# Patient Record
Sex: Female | Born: 1965 | Race: White | Hispanic: No | Marital: Married | Smoking: Never smoker
Health system: Southern US, Community
[De-identification: ages and names within clinical notes are randomized; demographics above are authoritative.]

## PROBLEM LIST (undated history)

## (undated) DIAGNOSIS — G709 Myoneural disorder, unspecified: Secondary | ICD-10-CM

## (undated) DIAGNOSIS — N319 Neuromuscular dysfunction of bladder, unspecified: Secondary | ICD-10-CM

## (undated) DIAGNOSIS — G35 Multiple sclerosis: Secondary | ICD-10-CM

## (undated) DIAGNOSIS — R21 Rash and other nonspecific skin eruption: Secondary | ICD-10-CM

## (undated) HISTORY — DX: Neuromuscular dysfunction of bladder, unspecified: N31.9

## (undated) HISTORY — PX: ABDOMINAL HYSTERECTOMY: SHX81

## (undated) HISTORY — DX: Multiple sclerosis: G35

---

## 1996-08-16 HISTORY — PX: KNEE SURGERY: SHX244

## 2004-10-26 ENCOUNTER — Other Ambulatory Visit: Admission: RE | Admit: 2004-10-26 | Discharge: 2004-10-26 | Payer: Self-pay | Admitting: Obstetrics & Gynecology

## 2008-11-04 ENCOUNTER — Encounter: Admission: RE | Admit: 2008-11-04 | Discharge: 2008-11-04 | Payer: Self-pay | Admitting: Family Medicine

## 2008-11-05 ENCOUNTER — Ambulatory Visit (HOSPITAL_BASED_OUTPATIENT_CLINIC_OR_DEPARTMENT_OTHER): Admission: RE | Admit: 2008-11-05 | Discharge: 2008-11-05 | Payer: Self-pay | Admitting: Otolaryngology

## 2010-09-02 ENCOUNTER — Encounter
Admission: RE | Admit: 2010-09-02 | Discharge: 2010-09-02 | Payer: Self-pay | Source: Home / Self Care | Attending: Obstetrics & Gynecology | Admitting: Obstetrics & Gynecology

## 2010-11-26 LAB — POCT HEMOGLOBIN-HEMACUE: Hemoglobin: 13.7 g/dL (ref 12.0–15.0)

## 2010-12-29 NOTE — Op Note (Signed)
NAME:  [name], [name]              ACCOUNT NO.:  [id number]   MEDICAL RECORD NO.:  [id number]          PATIENT TYPE:  AMB   LOCATION:  DSC                          FACILITY:  [facility]   PHYSICIAN:  Christopher E. Ezzard Standing, M.D.DATE OF BIRTH:  07/23/1966   DATE OF PROCEDURE:  11/05/2008  DATE OF DISCHARGE:                               OPERATIVE REPORT   PREOPERATIVE DIAGNOSIS:  Nasal fracture.   POSTOPERATIVE DIAGNOSIS:  Nasal fracture.   OPERATION:  Closed reduction nasal fracture.   SURGEON:  Kristine Garbe. Ezzard Standing, MD   ANESTHESIA:  General LMA.   COMPLICATIONS:  None.   BRIEF CLINICAL NOTE:  Rebecca Mejia is a 46 year old female who was  riding horses on this past Friday in the horse back and the horse's head  hit her nose sustaining a nosebleed and nasal fracture.  She had a CT  scan which shows a depressed right nasal bone fracture.  On examination,  she has slight depression of the right nasal bone.  There is no evidence  of septal hematoma and no significant septal deviation noted.  She is  taken to the operating room at this time for closed reduction nasal  fracture.   DESCRIPTION OF PROCEDURE:  After adequate general anesthetic with LMA,  the patient received 1 gram Ancef IV preoperatively.  Using the butter  knife, the right nasal bone was elevated.  After elevating nasal bone, I  had a tendency to drift back more medial and to support the elevation of  the right nasal bone.  The nose was packed with 1/2 inch of gauze soaked  in bacitracin ointment and a small piece of Telfa placed along the floor  of the nose on the right side only.  No pack was required on the left  side.  After adequate elevation and packing of the nose,  Steri-Strips  and a thermoplastic cast was applied to the nasal dorsum to support and  protect the nasal fracture.  This completed the procedure.  Alante was  awoke from anesthesia and transferred to the recovery room post postop  doing well.   DISPOSITION:  Tequlia is discharged home later this morning on Keflex  500 mg b.i.d. as long as the packing was in along with Tylenol and  Vicodin p.r.n. pain.  However, follow up in my office in 2 days for  recheck and removal of the nasal packing.           ______________________________  Kristine Garbe. Ezzard Standing, M.D.    CEN/MEDQ  D:  11/05/2008  T:  11/06/2008  Job:  [id number]   cc:   Gloriajean Dell. Andrey Campanile, M.D.

## 2011-08-17 HISTORY — PX: NOSE SURGERY: SHX723

## 2012-08-16 HISTORY — PX: OTHER SURGICAL HISTORY: SHX169

## 2015-02-27 ENCOUNTER — Other Ambulatory Visit: Payer: Self-pay | Admitting: Orthopedic Surgery

## 2016-01-29 DIAGNOSIS — M5416 Radiculopathy, lumbar region: Secondary | ICD-10-CM | POA: Insufficient documentation

## 2016-01-29 DIAGNOSIS — M7060 Trochanteric bursitis, unspecified hip: Secondary | ICD-10-CM | POA: Insufficient documentation

## 2016-01-29 DIAGNOSIS — H8110 Benign paroxysmal vertigo, unspecified ear: Secondary | ICD-10-CM | POA: Insufficient documentation

## 2016-04-07 ENCOUNTER — Ambulatory Visit (INDEPENDENT_AMBULATORY_CARE_PROVIDER_SITE_OTHER): Payer: Managed Care, Other (non HMO) | Admitting: Neurology

## 2016-04-07 ENCOUNTER — Encounter: Payer: Self-pay | Admitting: Neurology

## 2016-04-07 VITALS — BP 120/80 | HR 68 | Resp 20 | Ht 64.0 in | Wt 131.0 lb

## 2016-04-07 DIAGNOSIS — G35 Multiple sclerosis: Secondary | ICD-10-CM | POA: Diagnosis not present

## 2016-04-07 DIAGNOSIS — N319 Neuromuscular dysfunction of bladder, unspecified: Secondary | ICD-10-CM

## 2016-04-07 DIAGNOSIS — Z5181 Encounter for therapeutic drug level monitoring: Secondary | ICD-10-CM

## 2016-04-07 HISTORY — DX: Neuromuscular dysfunction of bladder, unspecified: N31.9

## 2016-04-07 MED ORDER — OXYBUTYNIN CHLORIDE 5 MG PO TABS: 5.0000 mg | ORAL_TABLET | Freq: Two times a day (BID) | ORAL | 3 refills | Status: DC

## 2016-04-07 MED ORDER — CHOLECALCIFEROL 50 MCG (2000 UT) PO CAPS: 2000.0000 [IU] | ORAL_CAPSULE | Freq: Every day | ORAL | Status: DC

## 2016-04-07 NOTE — Progress Notes (Signed)
Reason for visit: Multiple sclerosis  Referring physician: Dr. Gwenevere Ghazi Rebecca Mejia is a 50 y.o. female  History of present illness:  Rebecca Mejia is a 50 year old right-handed white female with a history of multiple sclerosis that initially was diagnosed in March 2011. In January of that year, she began having problems with blurred vision and dizziness. The patient eventually underwent MRI of the brain which showed multiple white matter lesions, the diagnosis was confirmed with blood work, spinal fluid analysis, and a visual evoked response test that revealed at abnormalities on the left. The patient was started on Rebif, but she could not tolerate the full dose, and she uses the half dose without significant side effects. The patient has been on the same medication since 2011. She indicates that in the first 18 months after diagnosis, she was having frequent attacks but she did not wish to go on another medication. The patient has not had any new symptoms in 2 or 3 years. She reports some mild cognitive slowing, she has some urinary urgency, frequency and incontinence. She does have some constipation with the bowels. The patient has some slight imbalance, she has not had any significant falls. She does have dizziness if she is too active. She takes Provigil for fatigue which has been helpful for her. She does have some heat intolerance issues. She denies any residual numbness or weakness of the face, arms, or legs. She denies any double vision. Her current neurologist, Dr. Baltazar Najjar, is moving away, and she is seeking a new physician for her multiple sclerosis.  Past Medical History:  Diagnosis Date  . MS (multiple sclerosis) ([place])     Past Surgical History:  Procedure Laterality Date  . arm surgery    . KNEE SURGERY    . NOSE SURGERY      Family History  Problem Relation Age of Onset  . Heart attack Mother     Social history:  reports that she has never smoked. She has never used  smokeless tobacco. She reports that she drinks alcohol. She reports that she does not use drugs.  Medications:  Prior to Admission medications   Medication Sig Start Date End Date Taking? Authorizing Provider  baclofen (LIORESAL) 10 MG tablet Take 10 mg by mouth.   Yes Historical Provider, MD  INTERFERON BETA-1A Inject 22 mcg into the skin 3 (three) times a week.   Yes Historical Provider, MD  modafinil (PROVIGIL) 100 MG tablet Take 100 mg by mouth daily. 02/06/14  Yes Historical Provider, MD      Allergies  Allergen Reactions  . Codeine Hives    ROS:  Out of a complete 14 system review of symptoms, the patient complains only of the following symptoms, and all other reviewed systems are negative.  Blurred vision Dizziness Fatigue  Blood pressure 120/80, pulse 68, resp. rate 20, height 5\' 4"  (1.626 m), weight 131 lb (59.4 kg).  Physical Exam  General: The patient is alert and cooperative at the time of the examination.  Eyes: Pupils are equal, round, and reactive to light. Discs are flat bilaterally.  Neck: The neck is supple, no carotid bruits are noted.  Respiratory: The respiratory examination is clear.  Cardiovascular: The cardiovascular examination reveals a regular rate and rhythm, no obvious murmurs or rubs are noted.  Skin: Extremities are without significant edema.  Neurologic Exam  Mental status: The patient is alert and oriented x 3 at the time of the examination. The patient has apparent normal recent  and remote memory, with an apparently normal attention span and concentration ability.  Cranial nerves: Facial symmetry is present. There is good sensation of the face to pinprick and soft touch bilaterally. The strength of the facial muscles and the muscles to head turning and shoulder shrug are normal bilaterally. Speech is very slightly dysarthric, not aphasic. Extraocular movements are full. Visual fields are full. The tongue is midline, and the patient has  symmetric elevation of the soft palate. No obvious hearing deficits are noted.  Motor: The motor testing reveals 5 over 5 strength of all 4 extremities. Good symmetric motor tone is noted throughout.  Sensory: Sensory testing is intact to pinprick, soft touch, vibration sensation, and position sense on all 4 extremities. No evidence of extinction is noted.  Coordination: Cerebellar testing reveals good finger-nose-finger and heel-to-shin bilaterally.  Gait and station: Gait is normal. Tandem gait is minimally unsteady. Romberg is negative. No drift is seen.  Reflexes: Deep tendon reflexes are symmetric, but are slightly brisk in the lower extremities. Toes are downgoing bilaterally.   Assessment/Plan:  1. Multiple sclerosis  2. Neurogenic bladder  The patient will remain on Rebif for now. We will try to get the disc of the last MRI done through [facility] in September 2016. The patient will be placed on oxybutynin 5 mg twice daily for the neurogenic bladder. She will have blood work done today. She will follow-up in 6 months, sooner if needed. We will plan on rechecking a MRI of the brain within the next 6-12 months.  Jill Alexanders MD 04/07/2016 2:01 PM  [facility] [address] [place], [place] [address]  Phone [phone] Fax [phone]

## 2016-04-07 NOTE — Patient Instructions (Addendum)
   Go on Vitamin D 2000 IU daily.  Multiple Sclerosis Multiple sclerosis (MS) is a disease of the central nervous system. It leads to the loss of the insulating covering of the nerves (myelin sheath) of your brain. When this happens, brain signals do not get sent properly or may not get sent at all. The age of onset of MS varies.  CAUSES The cause of MS is unknown. However, it is more common in the [place] than in the [place]. RISK FACTORS There is a higher number of women with MS than men. MS is not an illness that is passed down to you from your family members (inherited). However, your risk of MS is higher if you have a relative with MS. SIGNS AND SYMPTOMS  The symptoms of MS occur in episodes or attacks. These attacks may last weeks to months. There may be long periods of almost no symptoms between attacks. The symptoms of MS vary. This is because of the many different ways it affects the central nervous system. The main symptoms of MS include:  Vision problems and eye pain.  Numbness.  Weakness.  Inability to move your arms, hands, feet, or legs (paralysis).  Balance problems.  Tremors. DIAGNOSIS  Your health care provider can diagnose MS with the help of imaging exams and lab tests. These may include specialized X-ray exams and spinal fluid tests. The best imaging exam to confirm a diagnosis of MS is an MRI. TREATMENT  There is no known cure for MS, but there are medicines that can decrease the number and frequency of attacks. Steroids are often used for short-term relief. Physical and occupational therapy may also help. There are also many new alternative or complementary treatments available to help control the symptoms of MS. Ask your health care provider if any of these other options are right for you. HOME CARE INSTRUCTIONS   Take medicines as directed by your health care provider.  Exercise as directed by your health care provider. SEEK  MEDICAL CARE IF: You begin to feel depressed. SEEK IMMEDIATE MEDICAL CARE IF:  You develop paralysis.  You have problems with bladder, bowel, or sexual function.  You develop mental changes, such as forgetfulness or mood swings.  You have a period of uncontrolled movements (seizure).   This information is not intended to replace advice given to you by your health care provider. Make sure you discuss any questions you have with your health care provider.   Document Released: 07/30/2000 Document Revised: 08/07/2013 Document Reviewed: 04/09/2013 Elsevier Interactive Patient Education [employer].

## 2016-04-08 ENCOUNTER — Telehealth: Payer: Self-pay

## 2016-04-08 LAB — CBC WITH DIFFERENTIAL/PLATELET
Basophils Absolute: 0 10*3/uL (ref 0.0–0.2)
Basos: 0 %
EOS (ABSOLUTE): 0 10*3/uL (ref 0.0–0.4)
Eos: 1 %
Hematocrit: 37.4 % (ref 34.0–46.6)
Hemoglobin: 12.5 g/dL (ref 11.1–15.9)
Immature Grans (Abs): 0 10*3/uL (ref 0.0–0.1)
Immature Granulocytes: 0 %
Lymphocytes Absolute: 1.8 10*3/uL (ref 0.7–3.1)
Lymphs: 36 %
MCH: 31.6 pg (ref 26.6–33.0)
MCHC: 33.4 g/dL (ref 31.5–35.7)
MCV: 94 fL (ref 79–97)
Monocytes Absolute: 0.4 10*3/uL (ref 0.1–0.9)
Monocytes: 8 %
Neutrophils Absolute: 2.7 10*3/uL (ref 1.4–7.0)
Neutrophils: 55 %
Platelets: 214 10*3/uL (ref 150–379)
RBC: 3.96 x10E6/uL (ref 3.77–5.28)
RDW: 12.6 % (ref 12.3–15.4)
WBC: 4.9 10*3/uL (ref 3.4–10.8)

## 2016-04-08 LAB — COMPREHENSIVE METABOLIC PANEL
ALT: 19 IU/L (ref 0–32)
AST: 28 IU/L (ref 0–40)
Albumin/Globulin Ratio: 1.9 (ref 1.2–2.2)
Albumin: 4.4 g/dL (ref 3.5–5.5)
Alkaline Phosphatase: 63 IU/L (ref 39–117)
BUN/Creatinine Ratio: 16 (ref 9–23)
BUN: 13 mg/dL (ref 6–24)
Bilirubin Total: 0.3 mg/dL (ref 0.0–1.2)
CO2: 26 mmol/L (ref 18–29)
Calcium: 9.8 mg/dL (ref 8.7–10.2)
Chloride: 99 mmol/L (ref 96–106)
Creatinine, Ser: 0.82 mg/dL (ref 0.57–1.00)
GFR calc Af Amer: 95 mL/min/{1.73_m2} (ref >59)
GFR calc non Af Amer: 83 mL/min/{1.73_m2} (ref >59)
Globulin, Total: 2.3 g/dL (ref 1.5–4.5)
Glucose: 81 mg/dL (ref 65–99)
Potassium: 4.4 mmol/L (ref 3.5–5.2)
Sodium: 138 mmol/L (ref 134–144)
Total Protein: 6.7 g/dL (ref 6.0–8.5)

## 2016-04-08 NOTE — Telephone Encounter (Signed)
-----   Message from Kathrynn Ducking, MD sent at 04/08/2016  7:26 AM EDT -----  The blood work results are unremarkable. Please call the patient.  ----- Message ----- From: Lavone Neri Lab Results In Sent: 04/08/2016   5:40 AM To: Kathrynn Ducking, MD

## 2016-04-08 NOTE — Telephone Encounter (Signed)
I spoke to Rebecca Mejia and advised her that per Dr. Jannifer Franklin, her results are unremarkable. Rebecca Mejia verbalized understanding of results. Rebecca Mejia had no questions at this time but was encouraged to call back if questions arise.

## 2016-04-09 ENCOUNTER — Telehealth: Payer: Self-pay

## 2016-04-09 NOTE — Telephone Encounter (Signed)
Spoke to medical records at [facility]. They agreed to mail copy of MRI disc to our office, attn: myself and/or Dr. Jannifer Franklin.

## 2016-04-09 NOTE — Telephone Encounter (Signed)
-----   Message from Kathrynn Ducking, MD sent at 04/07/2016  2:43 PM EDT ----- Please call [facility] and get the disc of the MRI brain from 9/16 sent to this office. Thanks.

## 2016-05-21 ENCOUNTER — Other Ambulatory Visit: Payer: Self-pay

## 2016-05-21 NOTE — Telephone Encounter (Signed)
Received faxed order form from [facility] for Rebif refills which was completed. Awaiting MD review/signature.

## 2016-05-21 NOTE — Telephone Encounter (Signed)
Rx order form signed and faxed to [facility].

## 2016-05-31 ENCOUNTER — Telehealth: Payer: Self-pay | Admitting: Neurology

## 2016-05-31 NOTE — Telephone Encounter (Signed)
Patient is calling. She has MS and is going on a camping trip this weekend and is concerned about getting a bladder infection. Can medication be called in just in case she does get an infection to [facility] on [address]. Please call patient and advise.

## 2016-05-31 NOTE — Telephone Encounter (Signed)
Dr Willis- please advise 

## 2016-05-31 NOTE — Telephone Encounter (Signed)
I called patient. The patient indicates that she is going to be camping only for the weekend, I would probably not call in A prescription for antibiotic unless she actually became symptomatic with a bladder infection.  She is to let me know if she has problems.

## 2016-06-01 MED ORDER — CIPROFLOXACIN HCL 250 MG PO TABS: 250.0000 mg | ORAL_TABLET | Freq: Two times a day (BID) | ORAL | 0 refills | Status: DC

## 2016-06-01 NOTE — Addendum Note (Signed)
Addended by: Margette Fast on: 06/01/2016 06:04 PM   Modules accepted: Orders

## 2016-06-01 NOTE — Telephone Encounter (Signed)
I spoke to pt and advised her that Dr. Brett Fairy (in Dr. Tobey Grim absence) does not feel comfortable prescribing an abx during pt's camping trip in case of bladder infection.  Pt says that she is going camping for a whole week (not just a weekend) and really wants Dr. Jannifer Franklin to reconsider giving her an ABX in case she gets a bladder infection while she is gone for a week.  Pt says that she is agreeable to waiting until Thursday when Dr. Jannifer Franklin returns.

## 2016-06-01 NOTE — Telephone Encounter (Signed)
Pt called back in stating she will be camping for a whole week and will be leaving on Saturday. Please call and advise [phone]

## 2016-06-01 NOTE — Telephone Encounter (Signed)
   I called the patient. [place] out of [place] in [place]. I will call in a 5 day course of Cipro to take only if needed.

## 2016-06-01 NOTE — Telephone Encounter (Signed)
Sent to [facility].  Pt is requesting an abx in case she gets a bladder infection while camping for a week, beginning Saturday.

## 2016-06-01 NOTE — Telephone Encounter (Signed)
See dr Jannifer Franklin comment, CD

## 2016-10-12 ENCOUNTER — Ambulatory Visit (INDEPENDENT_AMBULATORY_CARE_PROVIDER_SITE_OTHER): Payer: Managed Care, Other (non HMO) | Admitting: Neurology

## 2016-10-12 ENCOUNTER — Encounter: Payer: Self-pay | Admitting: *Deleted

## 2016-10-12 ENCOUNTER — Encounter: Payer: Self-pay | Admitting: Neurology

## 2016-10-12 VITALS — BP 130/80 | HR 68 | Ht 64.0 in | Wt 133.0 lb

## 2016-10-12 DIAGNOSIS — G35 Multiple sclerosis: Secondary | ICD-10-CM

## 2016-10-12 DIAGNOSIS — N319 Neuromuscular dysfunction of bladder, unspecified: Secondary | ICD-10-CM

## 2016-10-12 MED ORDER — MODAFINIL 100 MG PO TABS: 100.0000 mg | ORAL_TABLET | Freq: Every day | ORAL | 5 refills | Status: DC

## 2016-10-12 MED ORDER — BACLOFEN 10 MG PO TABS: 10.0000 mg | ORAL_TABLET | Freq: Two times a day (BID) | ORAL | 5 refills | Status: DC | PRN

## 2016-10-12 NOTE — Progress Notes (Signed)
Faxed printed/signed rx to pt pharmacy. [phone]. Received confirmation.

## 2016-10-12 NOTE — Progress Notes (Signed)
Reason for visit: Multiple sclerosis  Rebecca Mejia is an 51 y.o. female  History of present illness:  Ms. Bradd Canary is a 51 year old right-handed white female with a history of multiple sclerosis. The patient has done relatively well in terms of symptomatology with the disease, she does have some underlying fatigue issues and she has a neurogenic bladder, on oxybutynin. The patient reports no new symptoms since last seen, she is on half dose of the Rebif that she takes ongoing. The patient is tolerating the medication well. 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 Subsequently the fatigue has gotten a bit worse. The patient sleeps well at night. She returns to this office for an evaluation. She has not had any vision changes, new numbness or weakness or any changes in bowel or bladder control.  Past Medical History:  Diagnosis Date  . MS (multiple sclerosis) ([place])   . Neurogenic bladder 04/07/2016    Past Surgical History:  Procedure Laterality Date  . arm surgery    . KNEE SURGERY    . NOSE SURGERY      Family History  Problem Relation Age of Onset  . Heart attack Mother   . Osteoporosis Sister     Social history:  reports that she has never smoked. She has never used smokeless tobacco. She reports that she drinks alcohol. She reports that she does not use drugs.    Allergies  Allergen Reactions  . Codeine Hives    Medications:  Prior to Admission medications   Medication Sig Start Date End Date Taking? Authorizing Provider  baclofen (LIORESAL) 10 MG tablet Take 1 tablet (10 mg total) by mouth 2 (two) times daily as needed for muscle spasms. 10/12/16  Yes Kathrynn Ducking, MD  Cholecalciferol (HM VITAMIN D3) 2000 units CAPS Take 1 capsule (2,000 Units total) by mouth daily. 04/07/16  Yes Kathrynn Ducking, MD  ciprofloxacin (CIPRO) 250 MG tablet Take 1 tablet (250 mg total) by mouth 2 (two) times daily. 06/01/16   Yes Kathrynn Ducking, MD  INTERFERON BETA-1A Inject 22 mcg into the skin 3 (three) times a week.   Yes Historical Provider, MD  modafinil (PROVIGIL) 100 MG tablet Take 1 tablet (100 mg total) by mouth daily. 10/12/16  Yes Kathrynn Ducking, MD  oxybutynin (DITROPAN) 5 MG tablet Take 1 tablet (5 mg total) by mouth 2 (two) times daily. 04/07/16  Yes Kathrynn Ducking, MD    ROS:  Out of a complete 14 system review of symptoms, the patient complains only of the following symptoms, and all other reviewed systems are negative.  Fatigue  Blood pressure 130/80, pulse 68, height 5\' 4"  (1.626 m), weight 133 lb (60.3 kg).  Physical Exam  General: The patient is alert and cooperative at the time of the examination.  Skin: No significant peripheral edema is noted.   Neurologic Exam  Mental status: The patient is alert and oriented x 3 at the time of the examination. The patient has apparent normal recent and remote memory, with an apparently normal attention span and concentration ability.   Cranial nerves: Facial symmetry is present. Speech is normal, no aphasia or dysarthria is noted. Extraocular movements are full. Visual fields are full. Pupils are equal, round, and reactive to light. Discs are flat bilaterally.  Motor: The patient has good strength in all 4 extremities.  Sensory examination: Soft touch sensation is symmetric on the  face, arms, and legs.  Coordination: The patient has good finger-nose-finger and heel-to-shin bilaterally.  Gait and station: The patient has a normal gait. Tandem gait is normal. Romberg is negative. No drift is seen.  Reflexes: Deep tendon reflexes are symmetric.   Assessment/Plan:  1. Multiple sclerosis  2. Neurogenic bladder  The patient was given a prescription for Provigil and for baclofen. The patient is doing well on low-dose Rebif. She will follow-up in 6 months, we will repeat MRI evaluation of the brain at that time.  Jill Alexanders  MD 10/12/2016 2:58 PM  [facility] [address] [place], [place] [address]  Phone [phone] Fax [phone]

## 2017-02-02 ENCOUNTER — Telehealth: Payer: Self-pay | Admitting: Neurology

## 2017-02-02 MED ORDER — OXYBUTYNIN CHLORIDE 5 MG PO TABS: 5.0000 mg | ORAL_TABLET | Freq: Two times a day (BID) | ORAL | 3 refills | Status: DC

## 2017-02-02 NOTE — Telephone Encounter (Signed)
A prescription for oxybutynin was given

## 2017-02-02 NOTE — Telephone Encounter (Signed)
Pt request refill for medication that helps to empty the bladder. She doesn't remember the name of it. Please send to [facility]

## 2017-02-02 NOTE — Telephone Encounter (Signed)
Called and spoke with patient. Verified she was referring to oxybutinin. Advised last refill sent 04/07/16 qty60, 3 refills. She states she was only taking prn and not 1 tablet 2x/day. She stopped taking for awhile to see if she could go without it. Advised she should take medication as prescribed. She states she feels medication helped. Would like refills. Advised I will send to CW,MD to review/send refills. We will call back if there are any more questions.

## 2017-03-16 ENCOUNTER — Telehealth: Payer: Self-pay | Admitting: Neurology

## 2017-03-16 DIAGNOSIS — G35 Multiple sclerosis: Secondary | ICD-10-CM

## 2017-03-16 NOTE — Telephone Encounter (Signed)
I called patient. We will get MRI the brain. She indicates that she had blood work done recently through her urologist who is not through [facility], she cannot remember his name.  She will call the office and have them fax the blood work done to our office. If a BUN and creatinine has not been done recently, we will check it before the MRI evaluation.

## 2017-03-16 NOTE — Addendum Note (Signed)
Addended by: Kathrynn Ducking on: 03/16/2017 11:40 AM   Modules accepted: Orders

## 2017-03-16 NOTE — Telephone Encounter (Signed)
Patient would like order put in to have an MRI. Last visit Dr. Jannifer Franklin wanted her to have MRI but never was scheduled.

## 2017-03-17 ENCOUNTER — Telehealth: Payer: Self-pay | Admitting: Neurology

## 2017-03-17 NOTE — Telephone Encounter (Signed)
I received blood work results done on 02/14/2017. BUN is 12, creatinine 0.78, estimated GFR is 87.  Sodium is 138, potassium 4.0, chloride 102, CO2 28, calcium 9.9, total protein 6.8, albumin of 4.3, liver panel is normal.

## 2017-04-11 ENCOUNTER — Ambulatory Visit
Admission: RE | Admit: 2017-04-11 | Discharge: 2017-04-11 | Disposition: A | Discharge status: Home / Self Care | Payer: Managed Care, Other (non HMO) | Source: Ambulatory Visit | Attending: Neurology | Admitting: Neurology

## 2017-04-11 DIAGNOSIS — G35 Multiple sclerosis: Secondary | ICD-10-CM | POA: Diagnosis not present

## 2017-04-11 MED ORDER — GADOBENATE DIMEGLUMINE 529 MG/ML IV SOLN
12.0000 mL | Freq: Once | INTRAVENOUS | Status: AC | PRN
  Administered 2017-04-11: 12 mL via INTRAVENOUS

## 2017-04-12 ENCOUNTER — Telehealth: Payer: Self-pay | Admitting: Neurology

## 2017-04-12 NOTE — Telephone Encounter (Signed)
I called patient. Minimal stable white matter changes seen, no acute or enhancing lesions.   MRI brain 04/11/17:  IMPRESSION:  This MRI of the brain with and without contrast shows the following: 1.   T2/FLAIR hyperintense foci in the left thalamus and in the periventricular, deep and juxtacortical white matter in a pattern and configuration consistent with chronic migraine any plaque associated with multiple sclerosis. None of the foci appears to be acute. 2.    There is a normal enhancement pattern is there are no acute findings.

## 2017-04-19 ENCOUNTER — Encounter: Payer: Self-pay | Admitting: Neurology

## 2017-04-19 ENCOUNTER — Telehealth: Payer: Self-pay | Admitting: Neurology

## 2017-04-19 ENCOUNTER — Ambulatory Visit (INDEPENDENT_AMBULATORY_CARE_PROVIDER_SITE_OTHER): Payer: Managed Care, Other (non HMO) | Admitting: Neurology

## 2017-04-19 VITALS — BP 123/70 | HR 68 | Ht 64.0 in | Wt 133.5 lb

## 2017-04-19 DIAGNOSIS — G35 Multiple sclerosis: Secondary | ICD-10-CM | POA: Diagnosis not present

## 2017-04-19 DIAGNOSIS — N319 Neuromuscular dysfunction of bladder, unspecified: Secondary | ICD-10-CM

## 2017-04-19 DIAGNOSIS — G35D Multiple sclerosis, unspecified: Secondary | ICD-10-CM

## 2017-04-19 NOTE — Telephone Encounter (Signed)
Pt will call to make 6 mo f/u apt.

## 2017-04-19 NOTE — Progress Notes (Signed)
Reason for visit: Multiple sclerosis  Rebecca Mejia is an 51 y.o. female  History of present illness:  Rebecca Mejia is a 51 year old right-handed white female for history of multiple sclerosis. The patient has done well on Rebif, she recently had MRI evaluation of the brain that showed minimal white matter changes, good stability was seen. She is on low-dose Rebif, she could not tolerate the higher dose. The patient has not had any definite MS exacerbations. She did have some problems with cloudy urine, she was treated with antibiotics, and eventually she saw a urology physician. She was told that she needed to perform in and out catheterizations, she has not been on the oxybutynin. The patient has not actually done the catheterizations, she believes that her urine has cleared up and she is voiding well at the moment. The patient returns to this office for an evaluation. She has not had any new numbness or weakness, vision changes, or changes in balance.  Past Medical History:  Diagnosis Date  . MS (multiple sclerosis) ([place])   . Neurogenic bladder 04/07/2016    Past Surgical History:  Procedure Laterality Date  . arm surgery    . KNEE SURGERY    . NOSE SURGERY      Family History  Problem Relation Age of Onset  . Heart attack Mother   . Osteoporosis Sister     Social history:  reports that she has never smoked. She has never used smokeless tobacco. She reports that she drinks alcohol. She reports that she does not use drugs.    Allergies  Allergen Reactions  . Codeine Hives    Medications:  Prior to Admission medications   Medication Sig Start Date End Date Taking? Authorizing Provider  baclofen (LIORESAL) 10 MG tablet Take 1 tablet (10 mg total) by mouth 2 (two) times daily as needed for muscle spasms. 10/12/16  Yes Kathrynn Ducking, MD  Cholecalciferol (HM VITAMIN D3) 2000 units CAPS Take 1 capsule (2,000 Units total) by mouth daily. 04/07/16  Yes Kathrynn Ducking, MD    INTERFERON BETA-1A Inject 22 mcg into the skin 3 (three) times a week.   Yes [provider]  modafinil (PROVIGIL) 100 MG tablet Take 1 tablet (100 mg total) by mouth daily. 10/12/16  Yes Kathrynn Ducking, MD  oxybutynin (DITROPAN) 5 MG tablet Take 1 tablet (5 mg total) by mouth 2 (two) times daily. Patient not taking: Reported on 04/19/2017 02/02/17   Kathrynn Ducking, MD    ROS:  Out of a complete 14 system review of symptoms, the patient complains only of the following symptoms, and all other reviewed systems are negative.  Difficulty voiding bladder  Blood pressure 123/70, pulse 68, height 5\' 4"  (1.626 m), weight 133 lb 8 oz (60.6 kg).  Physical Exam  General: The patient is alert and cooperative at the time of the examination.  Skin: No significant peripheral edema is noted.   Neurologic Exam  Mental status: The patient is alert and oriented x 3 at the time of the examination. The patient has apparent normal recent and remote memory, with an apparently normal attention span and concentration ability.   Cranial nerves: Facial symmetry is present. Speech is normal, no aphasia or dysarthria is noted. Extraocular movements are full. Visual fields are full.Pupils are equal, round, and reactive to light. Discs are flat bilaterally.  Motor: The patient has good strength in all 4 extremities.  Sensory examination: Soft touch sensation is symmetric on the  face, arms, and legs.  Coordination: The patient has good finger-nose-finger and heel-to-shin bilaterally.  Gait and station: The patient has a normal gait. Tandem gait is slightly unsteady. Romberg is negative. No drift is seen.  Reflexes: Deep tendon reflexes are symmetric.   MRI brain 04/11/17:  IMPRESSION: This MRI of the brain with and without contrast shows the following: 1. T2/FLAIR hyperintense foci in the left thalamus and in the periventricular, deep and juxtacortical white matter in a pattern and  configuration consistent with chronic migraine any plaque associated with multiple sclerosis. None of the foci appears to be acute. 2. There is a normal enhancement pattern is there are no acute findings.   * MRI scan images were reviewed online. I agree with the written report.    Assessment/Plan:  1. Multiple sclerosis  2. Neurogenic bladder  The patient is doing relatively well at this time, she will remain on low-dose Rebif. She will follow-up in 6 months. She has undergone recent blood work in July 2018 with a comprehensive metabolic profile, liver enzymes were normal. I will try to get the medical records from her urologist.   Jill Alexanders MD 04/19/2017 2:45 PM  [facility] [address] [place],  [address]  Phone [phone] Fax [phone]

## 2017-04-20 ENCOUNTER — Telehealth: Payer: Self-pay | Admitting: Neurology

## 2017-04-20 NOTE — Telephone Encounter (Signed)
This patient has been seen through urology, the evaluation included a urodynamic study, the patient would feel the bladder up to 800 mL, post void residual greater than 300 mL. The possibility of an InterStim stimulator was entertained. The patient was told that in out catheterizations may be a treatment option.

## 2017-04-29 ENCOUNTER — Other Ambulatory Visit: Payer: Self-pay | Admitting: Neurology

## 2017-05-16 ENCOUNTER — Other Ambulatory Visit: Payer: Self-pay | Admitting: Gastroenterology

## 2017-05-16 DIAGNOSIS — R1084 Generalized abdominal pain: Secondary | ICD-10-CM

## 2017-05-19 ENCOUNTER — Ambulatory Visit
Admission: RE | Admit: 2017-05-19 | Discharge: 2017-05-19 | Disposition: A | Discharge status: Home / Self Care | Payer: Managed Care, Other (non HMO) | Source: Ambulatory Visit | Attending: Gastroenterology | Admitting: Gastroenterology

## 2017-05-19 DIAGNOSIS — R1084 Generalized abdominal pain: Secondary | ICD-10-CM

## 2017-05-19 MED ORDER — IOPAMIDOL (ISOVUE-300) INJECTION 61%
100.0000 mL | Freq: Once | INTRAVENOUS | Status: AC | PRN
  Administered 2017-05-19: 100 mL via INTRAVENOUS

## 2017-06-09 ENCOUNTER — Encounter: Payer: Self-pay | Admitting: Obstetrics and Gynecology

## 2017-06-09 ENCOUNTER — Ambulatory Visit (INDEPENDENT_AMBULATORY_CARE_PROVIDER_SITE_OTHER): Payer: Managed Care, Other (non HMO) | Admitting: Obstetrics and Gynecology

## 2017-06-09 ENCOUNTER — Telehealth: Payer: Self-pay | Admitting: *Deleted

## 2017-06-09 VITALS — BP 112/60 | HR 68 | Resp 16 | Ht 62.75 in | Wt 133.0 lb

## 2017-06-09 DIAGNOSIS — G35 Multiple sclerosis: Secondary | ICD-10-CM | POA: Diagnosis not present

## 2017-06-09 DIAGNOSIS — R1084 Generalized abdominal pain: Secondary | ICD-10-CM

## 2017-06-09 DIAGNOSIS — N319 Neuromuscular dysfunction of bladder, unspecified: Secondary | ICD-10-CM | POA: Diagnosis not present

## 2017-06-09 DIAGNOSIS — D259 Leiomyoma of uterus, unspecified: Secondary | ICD-10-CM | POA: Diagnosis not present

## 2017-06-09 NOTE — Progress Notes (Addendum)
51 y.o. G0P0000 MarriedCaucasianF here to discuss fibroids and possible surgery.   The patient has a very large fibroid uterus, largest fibroid is 15.4 cm.  She doesn't go to the MD often, prior to the last month her last GYN visit was about 20 years ago. She started having discomft in her upper abdomen about a year ago. She was seen an MD at urgent care at the time and was told she was just constipated. She was then seen by GI MD last month and was diagnosed with a large abdominal mass. CT scan showed a very large fibroid uterus extending into her upper abdomen.  She c/o an intermittent tingling pain throughout her abdomen and can feel the hard mass in her upper abdomen. She wonders if this mass is contributing to her constipation or issues with emptying her bladder.  She has MS and suffers with chronic constipation and has trouble emptying her bladder. The urologist has talked to her about self catheterization. She has not wanted to do this and hasn't started it.  Last February she had very bad hot flashes for 2 weeks, then started skipping cycles. Her last 3 cycles have been monthly. She bleeds for 5-6 days. Saturates a super tampon in 3-4 hours. No BTB. Always had pain with sex, in the last 1.5 years it's better.  Period Duration (Days): 5-6 days  Period Pattern: (!) Irregular Menstrual Flow: Moderate, Heavy, Light Menstrual Control: Tampon, Thin pad Menstrual Control Change Freq (Hours): changes tampon/pad every 3-4 hours  Dysmenorrhea: (!) Moderate Dysmenorrhea Symptoms: Cramping, Other (Comment)  Patient's last menstrual period was 05/03/2017.          Sexually active: Yes.    The current method of family planning is vasectomy.    Exercising: Yes.    yard work  Smoker:  no  Health Maintenance: Pap:  06/03/17 WNL  History of abnormal Pap:  no MMG:  05/2017 WNL Colonoscopy:  Never BMD:   Never TDaP:  unsure Gardasil: N/A   reports that she has never smoked. She has never used  smokeless tobacco. She reports that she drinks about 4.2 oz of alcohol per week . She reports that she does not use drugs. She has her own horse business. Husband Rebecca Mejia is a Administrator.    Past Medical History:  Diagnosis Date  . MS (multiple sclerosis) ([place])   . Neurogenic bladder 04/07/2016    Past Surgical History:  Procedure Laterality Date  . arm surgery    . KNEE SURGERY    . NOSE SURGERY      Current Outpatient Prescriptions  Medication Sig Dispense Refill  . baclofen (LIORESAL) 10 MG tablet Take 1 tablet (10 mg total) by mouth 2 (two) times daily as needed for muscle spasms. 60 each 5  . Cholecalciferol (HM VITAMIN D3) 2000 units CAPS Take 1 capsule (2,000 Units total) by mouth daily. 30 each   . INTERFERON BETA-1A Inject 22 mcg into the skin 3 (three) times a week.    . modafinil (PROVIGIL) 100 MG tablet Take 1 tablet (100 mg total) by mouth daily. 30 tablet 5  . REBIF 22 MCG/0.5ML SOSY INJECT 22CMG SUBCUTANEOUSLY THREE TIMES A WEEK 12 Syringe 11   No current facility-administered medications for this visit.     Family History  Problem Relation Age of Onset  . Heart attack Mother   . Breast cancer Sister   . Osteoporosis Sister   . Breast cancer Maternal Grandmother     Review of  Systems  Constitutional: Negative.   HENT: Negative.   Eyes: Negative.   Respiratory: Negative.   Cardiovascular: Negative.   Gastrointestinal: Negative.   Endocrine: Negative.   Genitourinary: Negative.        Abnormal bleeding   Musculoskeletal: Negative.   Skin: Negative.   Allergic/Immunologic: Negative.   Neurological: Negative.   Psychiatric/Behavioral: Negative.     Exam:   BP 112/60 (BP Location: Right Arm, Patient Position: Sitting, Cuff Size: Normal)   Pulse 68   Resp 16   Ht 5' 2.75" (1.594 m)   Wt 133 lb (60.3 kg)   LMP 05/03/2017   BMI 23.75 kg/m   Weight change: @WEIGHTCHANGE @ Height:   Height: 5' 2.75" (159.4 cm)  Ht Readings from Last 3 Encounters:   06/09/17 5' 2.75" (1.594 m)  04/19/17 5\' 4"  (1.626 m)  10/12/16 5\' 4"  (1.626 m)    General appearance: alert, cooperative and appears stated age Head: Normocephalic, without obvious abnormality, atraumatic Neck: no adenopathy, supple, symmetrical, trachea midline and thyroid normal to inspection and palpation Lungs: clear to auscultation bilaterally Cardiovascular: regular rate and rhythm Abdomen: soft, non-tender; large mass in the entire upper abdomen, not filling the lower abdomen as much, not on the right side. Can palpate the uterus in the left lower abdomen, but mobile.  Extremities: extremities normal, atraumatic, no cyanosis or edema Skin: Skin color, texture, turgor normal. No rashes or lesions Lymph nodes: Cervical, supraclavicular, and axillary nodes normal. No abnormal inguinal nodes palpated Neurologic: Grossly normal   Pelvic: External genitalia:  no lesions              Urethra:  normal appearing urethra with no masses, tenderness or lesions              Bartholins and Skenes: normal                 Vagina: normal appearing vagina with normal color and discharge, no lesions              Cervix: no lesions               Bimanual Exam:  Uterus:  very large, irregularly shaped uterus, not in her pelvis, free laterally to the uterus on the right, can palpate the uterus toward the left pelvic side wall, large firm mass in her upper abdomen. Filling her upper abdomen.               Adnexa: no mass, fullness, tenderness               Rectovaginal: Confirms               Anus:  normal sphincter tone, no lesions  Chaperone was present for exam.  05/19/17 CT scan reviewed with the patient, large fibroid uterus, the largest bulk to the uterus is in the upper abdomen. The patient also has a markedly distended bladder. No adnexal masses were seen U/S done on 05/31/17 in GYN office describes a large fibroid uterus and a 7 x 5.4 x 7.28 cm multicystic mass on the right.   A:  Very  large symptomatic fibroid uterus  Right adnexal cyst seen on ultrasound, not on CT  ROMA risk was mildly elevated, mildly elevated CA 19-9, normal CA 125  Neurogenic bladder, her bladder was very enlarged on CT. I discussed with her the long term risk of kidney damage if she has reflux into her kidneys  P:   Records and imaging reviewed  Recent normal pap  Endometrial biopsy with disordered proliferative endometrium with focal glandular crowding  Discussed laparoscopic hysterectomy vs TAH  Discussed that use of lupron would increase the likelihood of a laparoscopic surgery  Addendum: I spoke with the radiologist who re looked at the CT scan, he does see some fluid in the right pelvis, not clear if she has an ovarian or tubal enlargement. No signs of malignancy.  I discussed the patient with Dr Denman George who stated she wouldn't do any thing different with the elevated ROMA or CA 19-9 levels. Suspects they are elevated by her large fibroid uterus. Dr Denman George would be okay with lupron or not using lupron and proceeding with surgery.  I spoke with the patient and her husband. She would like to proceed with surgery without laparoscopy, she is aware of the increased need for laparotomy.  Will get surgery scheduled and plan for a pre-operative anesthesia consult because of her MS. I will also reach out to her neurologist  CC: Dr Jannifer Franklin  06/13/17 Addendum: Dr Jannifer Franklin responded that he see's no contraindications for her surgery. I spoke with the patient. Reviewed the plan of total laparoscopic hysterectomy with bilateral salpingectomy, possible removal of right ovary and cystoscopy. I reviewed the risk of infection, bleeding, damage to nearby organs (bowel, bladder, ureters or vessels), discussed the possible need for laparotomy. Discussed that she would be getting antibiotics and lovenox.

## 2017-06-09 NOTE — Telephone Encounter (Signed)
Call to patient to discuss surgery date options for 06-21-17. Advised of tentative plan and will provide update after able to confirm final arrangements with hospital.  Patient agreeable.

## 2017-06-10 NOTE — Telephone Encounter (Signed)
Call to patient. Advised surgery is scheduled for Tuesday 06-21-17 at 0945 at [facility].  Surgery instruction sheet reviewed and printed copy will be mailed to patient with surgery center brochure. .  Bowel prep instructions reviewed.   Routing to provider for final review. Patient agreeable to disposition. Will close encounter.

## 2017-06-13 NOTE — H&P (Signed)
51 y.o. G0P0000 MarriedCaucasianF here to discuss fibroids and possible surgery.   The patient has a very large fibroid uterus, largest fibroid is 15.4 cm.  She doesn't go to the MD often, prior to the last month her last GYN visit was about 20 years ago. She started having discomft in her upper abdomen about a year ago. She was seen an MD at urgent care at the time and was told she was just constipated. She was then seen by GI MD last month and was diagnosed with a large abdominal mass. CT scan showed a very large fibroid uterus extending into her upper abdomen.  She c/o an intermittent tingling pain throughout her abdomen and can feel the hard mass in her upper abdomen. She wonders if this mass is contributing to her constipation or issues with emptying her bladder.  She has MS and suffers with chronic constipation and has trouble emptying her bladder. The urologist has talked to her about self catheterization. She has not wanted to do this and hasn't started it.  Last February she had very bad hot flashes for 2 weeks, then started skipping cycles. Her last 3 cycles have been monthly. She bleeds for 5-6 days. Saturates a super tampon in 3-4 hours. No BTB. Always had pain with sex, in the last 1.5 years it's better.  Period Duration (Days): 5-6 days  Period Pattern: (!) Irregular Menstrual Flow: Moderate, Heavy, Light Menstrual Control: Tampon, Thin pad Menstrual Control Change Freq (Hours): changes tampon/pad every 3-4 hours  Dysmenorrhea: (!) Moderate Dysmenorrhea Symptoms: Cramping, Other (Comment)  Patient's last menstrual period was 05/03/2017.          Sexually active: Yes.    The current method of family planning is vasectomy.    Exercising: Yes.    yard work  Smoker:  no  Health Maintenance: Pap:  06/03/17 WNL  History of abnormal Pap:  no MMG:  05/2017 WNL Colonoscopy:  Never BMD:   Never TDaP:  unsure Gardasil: N/A   reports that she has never smoked. She has never used  smokeless tobacco. She reports that she drinks about 4.2 oz of alcohol per week . She reports that she does not use drugs. She has her own horse business. Husband Rebecca Mejia is a Administrator.    Past Medical History:  Diagnosis Date  . MS (multiple sclerosis) ([place])   . Neurogenic bladder 04/07/2016    Past Surgical History:  Procedure Laterality Date  . arm surgery    . KNEE SURGERY    . NOSE SURGERY      Current Outpatient Prescriptions  Medication Sig Dispense Refill  . baclofen (LIORESAL) 10 MG tablet Take 1 tablet (10 mg total) by mouth 2 (two) times daily as needed for muscle spasms. 60 each 5  . Cholecalciferol (HM VITAMIN D3) 2000 units CAPS Take 1 capsule (2,000 Units total) by mouth daily. 30 each   . INTERFERON BETA-1A Inject 22 mcg into the skin 3 (three) times a week.    . modafinil (PROVIGIL) 100 MG tablet Take 1 tablet (100 mg total) by mouth daily. 30 tablet 5  . REBIF 22 MCG/0.5ML SOSY INJECT 22CMG SUBCUTANEOUSLY THREE TIMES A WEEK 12 Syringe 11   No current facility-administered medications for this visit.     Family History  Problem Relation Age of Onset  . Heart attack Mother   . Breast cancer Sister   . Osteoporosis Sister   . Breast cancer Maternal Grandmother     Review of  Systems  Constitutional: Negative.   HENT: Negative.   Eyes: Negative.   Respiratory: Negative.   Cardiovascular: Negative.   Gastrointestinal: Negative.   Endocrine: Negative.   Genitourinary: Negative.        Abnormal bleeding   Musculoskeletal: Negative.   Skin: Negative.   Allergic/Immunologic: Negative.   Neurological: Negative.   Psychiatric/Behavioral: Negative.     Exam:   BP 112/60 (BP Location: Right Arm, Patient Position: Sitting, Cuff Size: Normal)   Pulse 68   Resp 16   Ht 5' 2.75" (1.594 m)   Wt 133 lb (60.3 kg)   LMP 05/03/2017   BMI 23.75 kg/m   Weight change: @WEIGHTCHANGE @ Height:   Height: 5' 2.75" (159.4 cm)  Ht Readings from Last 3 Encounters:   06/09/17 5' 2.75" (1.594 m)  04/19/17 5\' 4"  (1.626 m)  10/12/16 5\' 4"  (1.626 m)    General appearance: alert, cooperative and appears stated age Head: Normocephalic, without obvious abnormality, atraumatic Neck: no adenopathy, supple, symmetrical, trachea midline and thyroid normal to inspection and palpation Lungs: clear to auscultation bilaterally Cardiovascular: regular rate and rhythm Abdomen: soft, non-tender; large mass in the entire upper abdomen, not filling the lower abdomen as much, not on the right side. Can palpate the uterus in the left lower abdomen, but mobile.  Extremities: extremities normal, atraumatic, no cyanosis or edema Skin: Skin color, texture, turgor normal. No rashes or lesions Lymph nodes: Cervical, supraclavicular, and axillary nodes normal. No abnormal inguinal nodes palpated Neurologic: Grossly normal   Pelvic: External genitalia:  no lesions              Urethra:  normal appearing urethra with no masses, tenderness or lesions              Bartholins and Skenes: normal                 Vagina: normal appearing vagina with normal color and discharge, no lesions              Cervix: no lesions               Bimanual Exam:  Uterus:  very large, irregularly shaped uterus, not in her pelvis, free laterally to the uterus on the right, can palpate the uterus toward the left pelvic side wall, large firm mass in her upper abdomen. Filling her upper abdomen.               Adnexa: no mass, fullness, tenderness               Rectovaginal: Confirms               Anus:  normal sphincter tone, no lesions  Chaperone was present for exam.  05/19/17 CT scan reviewed with the patient, large fibroid uterus, the largest bulk to the uterus is in the upper abdomen. The patient also has a markedly distended bladder. No adnexal masses were seen U/S done on 05/31/17 in GYN office describes a large fibroid uterus and a 7 x 5.4 x 7.28 cm multicystic mass on the right.   A:  Very  large symptomatic fibroid uterus  Right adnexal cyst seen on ultrasound, not on CT  ROMA risk was mildly elevated, mildly elevated CA 19-9, normal CA 125  Neurogenic bladder, her bladder was very enlarged on CT. I discussed with her the long term risk of kidney damage if she has reflux into her kidneys  P:   Records and imaging reviewed  Recent normal pap  Endometrial biopsy with disordered proliferative endometrium with focal glandular crowding  Discussed laparoscopic hysterectomy vs TAH  Discussed that use of lupron would increase the likelihood of a laparoscopic surgery  Addendum: I spoke with the radiologist who re looked at the CT scan, he does see some fluid in the right pelvis, not clear if she has an ovarian or tubal enlargement. No signs of malignancy.  I discussed the patient with Dr Denman George who stated she wouldn't do any thing different with the elevated ROMA or CA 19-9 levels. Suspects they are elevated by her large fibroid uterus. Dr Denman George would be okay with lupron or not using lupron and proceeding with surgery.  I spoke with the patient and her husband. She would like to proceed with surgery without laparoscopy, she is aware of the increased need for laparotomy.  Will get surgery scheduled and plan for a pre-operative anesthesia consult because of her MS. I will also reach out to her neurologist  CC: Dr Jannifer Franklin  06/13/17 Addendum: Dr Jannifer Franklin responded that he see's no contraindications for her surgery. I spoke with the patient. Reviewed the plan of total laparoscopic hysterectomy with bilateral salpingectomy, possible removal of right ovary and cystoscopy. I reviewed the risk of infection, bleeding, damage to nearby organs (bowel, bladder, ureters or vessels), discussed the possible need for laparotomy. Discussed that she would be getting antibiotics and lovenox.

## 2017-06-13 NOTE — Progress Notes (Signed)
Please place orders in EPIC as patient is being scheduled for a pre-op appointment! Thank you! 

## 2017-06-15 ENCOUNTER — Telehealth: Payer: Self-pay | Admitting: *Deleted

## 2017-06-15 NOTE — Telephone Encounter (Signed)
Per instructions from Dr Talbert Nan, patient may need self cath post -op. Can come in for instructions prior to surgery.  Call to patient. Advised Dr Talbert Nan wants patient to be aware of potential need for self cath due to current urinary retention and surgical procedure may exacerbate thus. Offered nurse visit for education of self cath prior to surgery. Patient states she has already been taught this process and received many supplies from urology office. Currently states she just needs additional practice but will call back if decides she wants nurse visit.   Routing to provider for final review. Patient agreeable to disposition. Will close encounter.

## 2017-06-16 NOTE — Patient Instructions (Addendum)
ANHTHU PERDEW  06/16/2017      Your procedure is scheduled on  06-21-17  Report to [facility]  at  7:30 A.M.     Call this number if you have problems the morning of surgery: [facility].   Remember:  Do not eat food or drink liquids after midnight.  Take these medicines the morning of surgery :  (interferon) REBIF,     Do not wear jewelry, make-up or nail polish.  Do not wear lotions, powders, or perfumes, or deoderant.  Do not shave 48 hours prior to surgery.  Men may shave face and neck.  Do not bring valuables to the hospital.  [facility] is not responsible for any belongings or valuables.  Contacts, dentures or bridgework may not be worn into surgery.  Leave your suitcase in the car.  After surgery it may be brought to your room.  For patients admitted to the hospital, discharge time will be determined by your treatment team.  Special instructions:  N/A  Please read over the following fact sheets that you were given:    [facility] - Preparing for Surgery Before surgery, you can play an important role.  Because skin is not sterile, your skin needs to be as free of germs as possible.  You can reduce the number of germs on your skin by washing with CHG (chlorahexidine gluconate) soap before surgery.  CHG is an antiseptic cleaner which kills germs and bonds with the skin to continue killing germs even after washing. Please DO NOT use if you have an allergy to CHG or antibacterial soaps.  If your skin becomes reddened/irritated stop using the CHG and inform your nurse when you arrive at Short Stay. Do not shave (including legs and underarms) for at least 48 hours prior to the first CHG shower.  You may shave your face/neck. Please follow these instructions carefully:  1.  Shower with CHG Soap the night before surgery and the  morning of Surgery.  2.  If you  choose to wash your hair, wash your hair first as usual with your  normal  shampoo.  3.  After you shampoo, rinse your hair and body thoroughly to remove the  shampoo.                           4.  Use CHG as you would any other liquid soap.  You can apply chg directly  to the skin and wash                       Gently with a scrungie or clean washcloth.  5.  Apply the CHG Soap to your body ONLY FROM THE NECK DOWN.   Do not use on face/ open                           Wound or open sores. Avoid contact with eyes, ears mouth and genitals (private parts).                       Wash face,  Genitals (private parts) with your normal soap.             6.  Wash thoroughly, paying special attention to the  area where your surgery  will be performed.  7.  Thoroughly rinse your body with warm water from the neck down.  8.  DO NOT shower/wash with your normal soap after using and rinsing off  the CHG Soap.                9.  Pat yourself dry with a clean towel.            10.  Wear clean pajamas.            11.  Place clean sheets on your bed the night of your first shower and do not  sleep with pets. Day of Surgery : Do not apply any lotions/deodorants the morning of surgery.  Please wear clean clothes to the hospital/surgery center.  FAILURE TO FOLLOW THESE INSTRUCTIONS MAY RESULT IN THE CANCELLATION OF YOUR SURGERY PATIENT SIGNATURE_________________________________  NURSE SIGNATURE__________________________________  ________________________________________________________________________   Adam Phenix  An incentive spirometer is a tool that can help keep your lungs clear and active. This tool measures how well you are filling your lungs with each breath. Taking long deep breaths may help reverse or decrease the chance of developing breathing (pulmonary) problems (especially infection) following:  A long period of time when you are unable to move or be active. BEFORE THE PROCEDURE   If  the spirometer includes an indicator to show your best effort, your nurse or respiratory therapist will set it to a desired goal.  If possible, sit up straight or lean slightly forward. Try not to slouch.  Hold the incentive spirometer in an upright position. INSTRUCTIONS FOR USE  1. Sit on the edge of your bed if possible, or sit up as far as you can in bed or on a chair. 2. Hold the incentive spirometer in an upright position. 3. Breathe out normally. 4. Place the mouthpiece in your mouth and seal your lips tightly around it. 5. Breathe in slowly and as deeply as possible, raising the piston or the ball toward the top of the column. 6. Hold your breath for 3-5 seconds or for as long as possible. Allow the piston or ball to fall to the bottom of the column. 7. Remove the mouthpiece from your mouth and breathe out normally. 8. Rest for a few seconds and repeat Steps 1 through 7 at least 10 times every 1-2 hours when you are awake. Take your time and take a few normal breaths between deep breaths. 9. The spirometer may include an indicator to show your best effort. Use the indicator as a goal to work toward during each repetition. 10. After each set of 10 deep breaths, practice coughing to be sure your lungs are clear. If you have an incision (the cut made at the time of surgery), support your incision when coughing by placing a pillow or rolled up towels firmly against it. Once you are able to get out of bed, walk around indoors and cough well. You may stop using the incentive spirometer when instructed by your caregiver.  RISKS AND COMPLICATIONS  Take your time so you do not get dizzy or light-headed.  If you are in pain, you may need to take or ask for pain medication before doing incentive spirometry. It is harder to take a deep breath if you are having pain. AFTER USE  Rest and breathe slowly and easily.  It can be helpful to keep track of a log of your progress. Your caregiver can  provide you with a  simple table to help with this. If you are using the spirometer at home, follow these instructions: [place] IF:   You are having difficultly using the spirometer.  You have trouble using the spirometer as often as instructed.  Your pain medication is not giving enough relief while using the spirometer.  You develop fever of 100.5 F (38.1 C) or higher. SEEK IMMEDIATE MEDICAL CARE IF:   You cough up bloody sputum that had not been present before.  You develop fever of 102 F (38.9 C) or greater.  You develop worsening pain at or near the incision site. MAKE SURE YOU:   Understand these instructions.  Will watch your condition.  Will get help right away if you are not doing well or get worse. Document Released: 12/13/2006 Document Revised: 10/25/2011 Document Reviewed: 02/13/2007 [facility] Patient Information 2014 [place], [place].   ________________________________________________________________________

## 2017-06-17 ENCOUNTER — Encounter (HOSPITAL_COMMUNITY)
Admission: RE | Admit: 2017-06-17 | Discharge: 2017-06-17 | Disposition: A | Discharge status: Home / Self Care | Payer: Managed Care, Other (non HMO) | Source: Ambulatory Visit | Attending: Obstetrics and Gynecology | Admitting: Obstetrics and Gynecology

## 2017-06-17 ENCOUNTER — Telehealth: Payer: Self-pay | Admitting: Obstetrics and Gynecology

## 2017-06-17 ENCOUNTER — Encounter (HOSPITAL_COMMUNITY): Payer: Self-pay

## 2017-06-17 DIAGNOSIS — D259 Leiomyoma of uterus, unspecified: Secondary | ICD-10-CM | POA: Diagnosis not present

## 2017-06-17 DIAGNOSIS — Z01818 Encounter for other preprocedural examination: Secondary | ICD-10-CM | POA: Insufficient documentation

## 2017-06-17 HISTORY — DX: Rash and other nonspecific skin eruption: R21

## 2017-06-17 LAB — COMPREHENSIVE METABOLIC PANEL
ALT: 26 U/L (ref 14–54)
AST: 35 U/L (ref 15–41)
Albumin: 3.9 g/dL (ref 3.5–5.0)
Alkaline Phosphatase: 62 U/L (ref 38–126)
Anion gap: 8 (ref 5–15)
BUN: 15 mg/dL (ref 6–20)
CO2: 26 mmol/L (ref 22–32)
Calcium: 9.3 mg/dL (ref 8.9–10.3)
Chloride: 104 mmol/L (ref 101–111)
Creatinine, Ser: 0.78 mg/dL (ref 0.44–1.00)
GFR calc Af Amer: >60 mL/min (ref >60)
GFR calc non Af Amer: >60 mL/min (ref >60)
Glucose, Bld: 90 mg/dL (ref 65–99)
Potassium: 4.1 mmol/L (ref 3.5–5.1)
Sodium: 138 mmol/L (ref 135–145)
Total Bilirubin: 0.4 mg/dL (ref 0.3–1.2)
Total Protein: 7.2 g/dL (ref 6.5–8.1)

## 2017-06-17 LAB — HCG, SERUM, QUALITATIVE: Preg, Serum: NEGATIVE

## 2017-06-17 LAB — ABO/RH: ABO/RH(D): O POS

## 2017-06-17 NOTE — Telephone Encounter (Signed)
Spoke with patient. Patient states that she has been able to self cath and empty her bladder Is having burning and irritation after. Asking if she can use AZO OTC for discomfort. Patient is having surgery on 06/21/2017. Spoke with Dr.Jertson who states she may use OTC AZO, but will need to stop 48  Hours prior to surgery. Patient verbalizes understanding.  Routing to provider for final review. Patient agreeable to disposition. Will close encounter.

## 2017-06-17 NOTE — Progress Notes (Signed)
CBCdiff 06-03-17 on chart from Dr Patrick [facility]

## 2017-06-17 NOTE — Telephone Encounter (Signed)
Patient left a message with the phone service that she was having pain and trouble inserting her catheter.

## 2017-06-17 NOTE — Progress Notes (Signed)
Per surgeon request, anesthesia consult was made d/t patient hx of Multiple Sclerosis. Per anesthesia Dr Bronwen Betters, patient needs general anesthesia to avoid MS exacerbation or flare as may occur with an epidural. MD also gave the okay for patient to take MS medication (interferon)REBIF and Provigil. RN relayed this info to patient. Patient verbalized understanding however states she does not want to take her Provigil AM of surgery.

## 2017-06-21 ENCOUNTER — Ambulatory Visit (HOSPITAL_BASED_OUTPATIENT_CLINIC_OR_DEPARTMENT_OTHER): Payer: Managed Care, Other (non HMO) | Admitting: Anesthesiology

## 2017-06-21 ENCOUNTER — Telehealth: Payer: Self-pay | Admitting: *Deleted

## 2017-06-21 ENCOUNTER — Encounter (HOSPITAL_BASED_OUTPATIENT_CLINIC_OR_DEPARTMENT_OTHER): Payer: Self-pay | Admitting: *Deleted

## 2017-06-21 ENCOUNTER — Ambulatory Visit (HOSPITAL_BASED_OUTPATIENT_CLINIC_OR_DEPARTMENT_OTHER)
Admission: RE | Admit: 2017-06-21 | Discharge: 2017-06-22 | Disposition: A | Discharge status: Home / Self Care | Payer: Managed Care, Other (non HMO) | Source: Ambulatory Visit | Attending: Obstetrics and Gynecology | Admitting: Obstetrics and Gynecology

## 2017-06-21 ENCOUNTER — Encounter (HOSPITAL_BASED_OUTPATIENT_CLINIC_OR_DEPARTMENT_OTHER)
Admission: RE | Disposition: A | Discharge status: Home / Self Care | Payer: Self-pay | Source: Ambulatory Visit | Attending: Obstetrics and Gynecology

## 2017-06-21 DIAGNOSIS — Z8262 Family history of osteoporosis: Secondary | ICD-10-CM | POA: Diagnosis not present

## 2017-06-21 DIAGNOSIS — G35 Multiple sclerosis: Secondary | ICD-10-CM | POA: Insufficient documentation

## 2017-06-21 DIAGNOSIS — D259 Leiomyoma of uterus, unspecified: Secondary | ICD-10-CM | POA: Insufficient documentation

## 2017-06-21 DIAGNOSIS — N840 Polyp of corpus uteri: Secondary | ICD-10-CM | POA: Insufficient documentation

## 2017-06-21 DIAGNOSIS — K5909 Other constipation: Secondary | ICD-10-CM | POA: Insufficient documentation

## 2017-06-21 DIAGNOSIS — N319 Neuromuscular dysfunction of bladder, unspecified: Secondary | ICD-10-CM | POA: Diagnosis not present

## 2017-06-21 DIAGNOSIS — Z8249 Family history of ischemic heart disease and other diseases of the circulatory system: Secondary | ICD-10-CM | POA: Insufficient documentation

## 2017-06-21 DIAGNOSIS — N838 Other noninflammatory disorders of ovary, fallopian tube and broad ligament: Secondary | ICD-10-CM | POA: Insufficient documentation

## 2017-06-21 DIAGNOSIS — Z9889 Other specified postprocedural states: Secondary | ICD-10-CM | POA: Diagnosis not present

## 2017-06-21 DIAGNOSIS — Z803 Family history of malignant neoplasm of breast: Secondary | ICD-10-CM | POA: Diagnosis not present

## 2017-06-21 DIAGNOSIS — Z79899 Other long term (current) drug therapy: Secondary | ICD-10-CM | POA: Insufficient documentation

## 2017-06-21 DIAGNOSIS — Z90710 Acquired absence of both cervix and uterus: Secondary | ICD-10-CM | POA: Diagnosis present

## 2017-06-21 HISTORY — DX: Myoneural disorder, unspecified: G70.9

## 2017-06-21 HISTORY — PX: CYSTOSCOPY: SHX5120

## 2017-06-21 HISTORY — PX: TOTAL LAPAROSCOPIC HYSTERECTOMY WITH SALPINGECTOMY: SHX6742

## 2017-06-21 LAB — CBC
HCT: 38.8 % (ref 36.0–46.0)
Hemoglobin: 13.1 g/dL (ref 12.0–15.0)
MCH: 31.1 pg (ref 26.0–34.0)
MCHC: 33.8 g/dL (ref 30.0–36.0)
MCV: 92.2 fL (ref 78.0–100.0)
Platelets: 217 10*3/uL (ref 150–400)
RBC: 4.21 MIL/uL (ref 3.87–5.11)
RDW: 12.1 % (ref 11.5–15.5)
WBC: 5 10*3/uL (ref 4.0–10.5)

## 2017-06-21 LAB — POCT HEMOGLOBIN-HEMACUE: Hemoglobin: 13 g/dL (ref 12.0–15.0)

## 2017-06-21 LAB — URINALYSIS, ROUTINE W REFLEX MICROSCOPIC
Bilirubin Urine: NEGATIVE
Glucose, UA: NEGATIVE mg/dL
Hgb urine dipstick: NEGATIVE
Ketones, ur: 20 mg/dL — AB
Nitrite: NEGATIVE
Protein, ur: NEGATIVE mg/dL
Specific Gravity, Urine: 1.008 (ref 1.005–1.030)
Squamous Epithelial / LPF: NONE SEEN
pH: 7 (ref 5.0–8.0)

## 2017-06-21 LAB — TYPE AND SCREEN
ABO/RH(D): O POS
Antibody Screen: NEGATIVE

## 2017-06-21 LAB — HEMOGLOBIN: Hemoglobin: 10.4 g/dL — ABNORMAL LOW (ref 12.0–15.0)

## 2017-06-21 SURGERY — HYSTERECTOMY, TOTAL, LAPAROSCOPIC, WITH SALPINGECTOMY
Anesthesia: General | Site: Bladder

## 2017-06-21 MED ORDER — SOD CITRATE-CITRIC ACID 500-334 MG/5ML PO SOLN
30.0000 mL | ORAL | Status: AC
  Administered 2017-06-21: 30 mL via ORAL
  Filled 2017-06-21: qty 30

## 2017-06-21 MED ORDER — SCOPOLAMINE 1 MG/3DAYS TD PT72
MEDICATED_PATCH | TRANSDERMAL | Status: AC
  Filled 2017-06-21: qty 1

## 2017-06-21 MED ORDER — DEXAMETHASONE SODIUM PHOSPHATE 4 MG/ML IJ SOLN
INTRAMUSCULAR | Status: DC | PRN
  Administered 2017-06-21: 10 mg via INTRAVENOUS

## 2017-06-21 MED ORDER — ONDANSETRON HCL 4 MG/2ML IJ SOLN
INTRAMUSCULAR | Status: AC
  Filled 2017-06-21: qty 2

## 2017-06-21 MED ORDER — ONDANSETRON HCL 4 MG/2ML IJ SOLN
INTRAMUSCULAR | Status: DC | PRN
  Administered 2017-06-21: 8 mg via INTRAVENOUS

## 2017-06-21 MED ORDER — DOCUSATE SODIUM 100 MG PO CAPS
100.0000 mg | ORAL_CAPSULE | Freq: Two times a day (BID) | ORAL | Status: DC
  Administered 2017-06-21: 100 mg via ORAL
  Filled 2017-06-21: qty 1

## 2017-06-21 MED ORDER — FENTANYL CITRATE (PF) 100 MCG/2ML IJ SOLN
INTRAMUSCULAR | Status: AC
  Filled 2017-06-21: qty 2

## 2017-06-21 MED ORDER — ROPIVACAINE HCL 5 MG/ML IJ SOLN
INTRAMUSCULAR | Status: DC | PRN
  Administered 2017-06-21: 60 mL

## 2017-06-21 MED ORDER — CEFAZOLIN SODIUM-DEXTROSE 2-4 GM/100ML-% IV SOLN
INTRAVENOUS | Status: AC
  Filled 2017-06-21: qty 100

## 2017-06-21 MED ORDER — HYDROCODONE-ACETAMINOPHEN 5-325 MG PO TABS
1.0000 | ORAL_TABLET | ORAL | Status: DC | PRN
  Filled 2017-06-21: qty 2

## 2017-06-21 MED ORDER — SIMETHICONE 80 MG PO CHEW
80.0000 mg | CHEWABLE_TABLET | Freq: Four times a day (QID) | ORAL | Status: DC | PRN
  Filled 2017-06-21: qty 1

## 2017-06-21 MED ORDER — PROPOFOL 10 MG/ML IV BOLUS
INTRAVENOUS | Status: AC
  Filled 2017-06-21: qty 20

## 2017-06-21 MED ORDER — LACTATED RINGERS IV SOLN
INTRAVENOUS | Status: DC
  Administered 2017-06-21 (×5): via INTRAVENOUS
  Filled 2017-06-21: qty 1000

## 2017-06-21 MED ORDER — KETOROLAC TROMETHAMINE 30 MG/ML IJ SOLN
INTRAMUSCULAR | Status: DC | PRN
  Administered 2017-06-21: 30 mg via INTRAVENOUS

## 2017-06-21 MED ORDER — LACTATED RINGERS IV SOLN
INTRAVENOUS | Status: DC
  Administered 2017-06-21: 09:00:00 via INTRAVENOUS
  Filled 2017-06-21 (×2): qty 1000

## 2017-06-21 MED ORDER — WHITE PETROLATUM EX OINT
TOPICAL_OINTMENT | CUTANEOUS | Status: AC
  Filled 2017-06-21: qty 5

## 2017-06-21 MED ORDER — ROCURONIUM BROMIDE 100 MG/10ML IV SOLN
INTRAVENOUS | Status: DC | PRN
  Administered 2017-06-21 (×4): 10 mg via INTRAVENOUS
  Administered 2017-06-21: 40 mg via INTRAVENOUS

## 2017-06-21 MED ORDER — ACETAMINOPHEN 325 MG PO TABS
650.0000 mg | ORAL_TABLET | ORAL | Status: DC | PRN
  Filled 2017-06-21: qty 2

## 2017-06-21 MED ORDER — ENOXAPARIN SODIUM 40 MG/0.4ML ~~LOC~~ SOLN
40.0000 mg | SUBCUTANEOUS | Status: AC
  Administered 2017-06-21: 40 mg via SUBCUTANEOUS
  Filled 2017-06-21: qty 0.4

## 2017-06-21 MED ORDER — SUGAMMADEX SODIUM 200 MG/2ML IV SOLN
INTRAVENOUS | Status: DC | PRN
  Administered 2017-06-21: 200 mg via INTRAVENOUS

## 2017-06-21 MED ORDER — SODIUM CHLORIDE 0.9 % IJ SOLN
INTRAMUSCULAR | Status: AC
  Filled 2017-06-21: qty 50

## 2017-06-21 MED ORDER — KETOROLAC TROMETHAMINE 30 MG/ML IJ SOLN
30.0000 mg | Freq: Four times a day (QID) | INTRAMUSCULAR | Status: DC
  Administered 2017-06-21 – 2017-06-22 (×2): 30 mg via INTRAVENOUS
  Filled 2017-06-21: qty 1

## 2017-06-21 MED ORDER — DEXAMETHASONE SODIUM PHOSPHATE 10 MG/ML IJ SOLN
INTRAMUSCULAR | Status: AC
  Filled 2017-06-21: qty 1

## 2017-06-21 MED ORDER — SCOPOLAMINE 1 MG/3DAYS TD PT72SCOPOLAMINE 1 MG/3DAYS
MEDICATED_PATCH | TRANSDERMAL | Status: DC | PRN
Start: 2017-06-21 — End: 2017-06-21
  Administered 2017-06-21: 1 via TRANSDERMAL

## 2017-06-21 MED ORDER — LIDOCAINE 2% (20 MG/ML) 5 ML SYRINGE
INTRAMUSCULAR | Status: AC
  Filled 2017-06-21: qty 5

## 2017-06-21 MED ORDER — MIDAZOLAM HCL 2 MG/2ML IJ SOLN
INTRAMUSCULAR | Status: AC
  Filled 2017-06-21: qty 2

## 2017-06-21 MED ORDER — PROPOFOL 10 MG/ML IV BOLUS
INTRAVENOUS | Status: DC | PRN
  Administered 2017-06-21: 50 mg via INTRAVENOUS
  Administered 2017-06-21: 150 mg via INTRAVENOUS
  Administered 2017-06-21: 50 mg via INTRAVENOUS

## 2017-06-21 MED ORDER — CEFAZOLIN SODIUM 1 G IJ SOLR
INTRAMUSCULAR | Status: AC
  Filled 2017-06-21: qty 10

## 2017-06-21 MED ORDER — KETOROLAC TROMETHAMINE 30 MG/ML IJ SOLN
INTRAMUSCULAR | Status: AC
  Filled 2017-06-21: qty 1

## 2017-06-21 MED ORDER — LIDOCAINE 2% (20 MG/ML) 5 ML SYRINGE
INTRAMUSCULAR | Status: DC | PRN
  Administered 2017-06-21: 1.5 mg/kg/h via INTRAVENOUS

## 2017-06-21 MED ORDER — ONDANSETRON HCL 4 MG PO TABS
4.0000 mg | ORAL_TABLET | Freq: Four times a day (QID) | ORAL | Status: DC | PRN
  Filled 2017-06-21: qty 1

## 2017-06-21 MED ORDER — ENOXAPARIN SODIUM 40 MG/0.4ML ~~LOC~~ SOLN
SUBCUTANEOUS | Status: AC
  Filled 2017-06-21: qty 0.4

## 2017-06-21 MED ORDER — ENOXAPARIN SODIUM 40 MG/0.4ML ~~LOC~~ SOLN
40.0000 mg | SUBCUTANEOUS | Status: DC
  Filled 2017-06-21: qty 0.4

## 2017-06-21 MED ORDER — PROMETHAZINE HCL 25 MG/ML IJ SOLN
6.2500 mg | INTRAMUSCULAR | Status: DC | PRN
Start: 2017-06-21 — End: 2017-06-22
  Filled 2017-06-21: qty 1

## 2017-06-21 MED ORDER — MIDAZOLAM HCL 5 MG/5ML IJ SOLN
INTRAMUSCULAR | Status: DC | PRN
  Administered 2017-06-21 (×2): 1 mg via INTRAVENOUS
  Administered 2017-06-21: 2 mg via INTRAVENOUS

## 2017-06-21 MED ORDER — MENTHOL 3 MG MT LOZG
1.0000 | LOZENGE | OROMUCOSAL | Status: DC | PRN
  Filled 2017-06-21: qty 9

## 2017-06-21 MED ORDER — FENTANYL CITRATE (PF) 100 MCG/2ML IJ SOLN
INTRAMUSCULAR | Status: DC | PRN
  Administered 2017-06-21: 25 ug via INTRAVENOUS
  Administered 2017-06-21 (×2): 50 ug via INTRAVENOUS
  Administered 2017-06-21 (×2): 25 ug via INTRAVENOUS
  Administered 2017-06-21 (×2): 50 ug via INTRAVENOUS
  Administered 2017-06-21 (×2): 25 ug via INTRAVENOUS
  Administered 2017-06-21: 50 ug via INTRAVENOUS
  Administered 2017-06-21: 25 ug via INTRAVENOUS

## 2017-06-21 MED ORDER — BUPIVACAINE HCL (PF) 0.25 % IJ SOLN
INTRAMUSCULAR | Status: DC | PRN
  Administered 2017-06-21: 10 mL

## 2017-06-21 MED ORDER — HYDROMORPHONE HCL 1 MG/ML IJ SOLN
INTRAMUSCULAR | Status: AC
  Filled 2017-06-21: qty 1

## 2017-06-21 MED ORDER — KETOROLAC TROMETHAMINE 30 MG/ML IJ SOLN
30.0000 mg | Freq: Four times a day (QID) | INTRAMUSCULAR | Status: DC
  Filled 2017-06-21: qty 1

## 2017-06-21 MED ORDER — ROCURONIUM BROMIDE 50 MG/5ML IV SOSY
PREFILLED_SYRINGE | INTRAVENOUS | Status: AC
  Filled 2017-06-21: qty 5

## 2017-06-21 MED ORDER — LIDOCAINE HCL (CARDIAC) 20 MG/ML IV SOLN
INTRAVENOUS | Status: DC | PRN
  Administered 2017-06-21: 80 mg via INTRAVENOUS

## 2017-06-21 MED ORDER — HYDROMORPHONE HCL 1 MG/ML IJ SOLN
0.2500 mg | INTRAMUSCULAR | Status: DC | PRN
  Filled 2017-06-21: qty 0.5

## 2017-06-21 MED ORDER — VASOPRESSIN 20 UNIT/ML IV SOLN
INTRAVENOUS | Status: DC | PRN
  Administered 2017-06-21: 11 [IU]

## 2017-06-21 MED ORDER — SOD CITRATE-CITRIC ACID 500-334 MG/5ML PO SOLN
ORAL | Status: AC
  Filled 2017-06-21: qty 30

## 2017-06-21 MED ORDER — CEFAZOLIN SODIUM-DEXTROSE 2-4 GM/100ML-% IV SOLN
2.0000 g | INTRAVENOUS | Status: AC
  Administered 2017-06-21 (×2): 2 g via INTRAVENOUS
  Filled 2017-06-21: qty 100

## 2017-06-21 MED ORDER — ONDANSETRON HCL 4 MG/2ML IJ SOLN
4.0000 mg | Freq: Four times a day (QID) | INTRAMUSCULAR | Status: DC | PRN
  Filled 2017-06-21: qty 2

## 2017-06-21 MED ORDER — SODIUM CHLORIDE 0.9 % IR SOLN
Status: DC | PRN
  Administered 2017-06-21: 1000 mL via INTRAVESICAL

## 2017-06-21 MED ORDER — KETOROLAC TROMETHAMINE 30 MG/ML IJ SOLN
30.0000 mg | Freq: Once | INTRAMUSCULAR | Status: DC | PRN
  Filled 2017-06-21: qty 1

## 2017-06-21 MED ORDER — VASOPRESSIN 20 UNIT/ML IV SOLN
INTRAVENOUS | Status: AC
  Filled 2017-06-21: qty 1

## 2017-06-21 MED ORDER — DOCUSATE SODIUM 100 MG PO CAPS
ORAL_CAPSULE | ORAL | Status: AC
  Filled 2017-06-21: qty 1

## 2017-06-21 MED ORDER — LIDOCAINE HCL (PF) 2 % IJ SOLN: INTRAMUSCULAR | Status: DC | PRN

## 2017-06-21 MED ORDER — ZOLPIDEM TARTRATE 5 MG PO TABS
5.0000 mg | ORAL_TABLET | Freq: Every evening | ORAL | Status: DC | PRN
Start: 2017-06-21 — End: 2017-06-22
  Filled 2017-06-21: qty 1

## 2017-06-21 MED ORDER — HYDROMORPHONE HCL 1 MG/ML IJ SOLN
0.2000 mg | INTRAMUSCULAR | Status: DC | PRN
  Administered 2017-06-21 – 2017-06-22 (×2): 0.5 mg via INTRAVENOUS
  Filled 2017-06-21: qty 1

## 2017-06-21 MED ORDER — KCL IN DEXTROSE-NACL 20-5-0.45 MEQ/L-%-% IV SOLN
INTRAVENOUS | Status: DC
  Administered 2017-06-21: 23:00:00 via INTRAVENOUS
  Filled 2017-06-21 (×3): qty 1000

## 2017-06-21 SURGICAL SUPPLY — 76 items
ADH SKN CLS APL DERMABOND .7 (GAUZE/BANDAGES/DRESSINGS) ×4
APL SRG 38 LTWT LNG FL B (MISCELLANEOUS) ×4
APPLICATOR ARISTA FLEXITIP XL (MISCELLANEOUS) ×3 IMPLANT
APPLICATOR COTTON TIP 6IN STRL (MISCELLANEOUS) ×3 IMPLANT
BLADE 10 SAFETY STRL DISP (BLADE) ×21 IMPLANT
CABLE HIGH FREQUENCY MONO STRZ (ELECTRODE) ×3 IMPLANT
CANISTER SUCT 3000ML PPV (MISCELLANEOUS) ×5 IMPLANT
CELL SAVER LIPIGURD (MISCELLANEOUS) ×4 IMPLANT
CLOTH BEACON ORANGE TIMEOUT ST (SAFETY) ×5 IMPLANT
CONT SPEC 4OZ CLIKSEAL STRL BL (MISCELLANEOUS) ×3 IMPLANT
COUNTER NEEDLE 1200 MAGNETIC (NEEDLE) ×3 IMPLANT
COVER MAYO STAND STRL (DRAPES) ×5 IMPLANT
COVER SURGICAL LIGHT HANDLE (MISCELLANEOUS) ×3 IMPLANT
COVER TABLE BACK 60X90 (DRAPES) ×3 IMPLANT
DECANTER SPIKE VIAL GLASS SM (MISCELLANEOUS) ×6 IMPLANT
DERMABOND ADVANCED (GAUZE/BANDAGES/DRESSINGS) ×1
DERMABOND ADVANCED .7 DNX12 (GAUZE/BANDAGES/DRESSINGS) ×4 IMPLANT
DEVICE RETRIEVAL ALEXIS 14 (MISCELLANEOUS) IMPLANT
DRSG OPSITE POSTOP 3X4 (GAUZE/BANDAGES/DRESSINGS) ×3 IMPLANT
DURAPREP 26ML APPLICATOR (WOUND CARE) ×5 IMPLANT
EXTRT SYSTEM ALEXIS 14CM (MISCELLANEOUS) ×5
EXTRT SYSTEM ALEXIS 17CM (MISCELLANEOUS) ×10
GAUZE SPONGE 4X4 16PLY XRAY LF (GAUZE/BANDAGES/DRESSINGS) ×3 IMPLANT
GLOVE BIO SURGEON STRL SZ 6.5 (GLOVE) ×8 IMPLANT
GLOVE BIOGEL PI IND STRL 7.0 (GLOVE) ×16 IMPLANT
GLOVE BIOGEL PI IND STRL 7.5 (GLOVE) ×16 IMPLANT
GLOVE BIOGEL PI INDICATOR 7.0 (GLOVE) ×4
GLOVE BIOGEL PI INDICATOR 7.5 (GLOVE) ×8
GOWN STRL REUS W/TWL LRG LVL3 (GOWN DISPOSABLE) ×23 IMPLANT
HARMONIC RUM II 2.5CM SILVER (DISPOSABLE) ×5
HARMONIC RUM II 3.0CM SILVER (DISPOSABLE)
HARMONIC RUM II 3.5CM SILVER (DISPOSABLE)
HARMONIC RUM II 4.0CM SILVER (DISPOSABLE)
HEMOSTAT ARISTA ABSORB 3G PWDR (MISCELLANEOUS) ×3 IMPLANT
LIGASURE VESSEL 5MM BLUNT TIP (ELECTROSURGICAL) ×5 IMPLANT
MARKER PEN SURG W/LABELS BLK (STERILIZATION PRODUCTS) ×3 IMPLANT
NEEDLE INSUFFLATION 120MM (ENDOMECHANICALS) ×5 IMPLANT
PACK LAPAROSCOPY BASIN (CUSTOM PROCEDURE TRAY) ×5 IMPLANT
PACK TRENDGUARD 450 HYBRID PRO (MISCELLANEOUS) ×2 IMPLANT
PACK TRENDGUARD 600 HYBRD PROC (MISCELLANEOUS) IMPLANT
POUCH LAPAROSCOPIC INSTRUMENT (MISCELLANEOUS) ×8 IMPLANT
PROTECTOR NERVE ULNAR (MISCELLANEOUS) ×10 IMPLANT
RETRACTOR WOUND ALXS 19CM XSML (INSTRUMENTS) ×6 IMPLANT
RTRCTR WOUND ALEXIS 19CM XSML (INSTRUMENTS) ×15
SCALPEL HRMNC RUM II 2.5 SILVR (DISPOSABLE) ×2 IMPLANT
SCALPEL HRMNC RUM II 3.0 SILVR (DISPOSABLE) IMPLANT
SCALPEL HRMNC RUM II 3.5 SILVR (DISPOSABLE) IMPLANT
SCALPEL HRMNC RUM II 4.0 SILVR (DISPOSABLE) IMPLANT
SCISSORS LAP 5X35 DISP (ENDOMECHANICALS) IMPLANT
SET CYSTO W/LG BORE CLAMP LF (SET/KITS/TRAYS/PACK) ×5 IMPLANT
SET IRRIG TUBING LAPAROSCOPIC (IRRIGATION / IRRIGATOR) ×5 IMPLANT
SET TRI-LUMEN FLTR TB AIRSEAL (TUBING) ×5 IMPLANT
SHEARS HARMONIC ACE PLUS 36CM (ENDOMECHANICALS) ×5 IMPLANT
SUT VIC AB 0 CT1 27 (SUTURE) ×5
SUT VIC AB 0 CT1 27XBRD ANBCTR (SUTURE) ×4 IMPLANT
SUT VICRYL 0 UR6 27IN ABS (SUTURE) ×14 IMPLANT
SUT VICRYL 4-0 PS2 18IN ABS (SUTURE) ×5 IMPLANT
SUT VLOC 180 0 9IN  GS21 (SUTURE) ×2
SUT VLOC 180 0 9IN GS21 (SUTURE) ×4 IMPLANT
SYR 50ML LL SCALE MARK (SYRINGE) ×10 IMPLANT
SYR CONTROL 10ML LL (SYRINGE) ×3 IMPLANT
SYSTEM CONTND EXTRCTN KII BLLN (MISCELLANEOUS) ×4 IMPLANT
TIP RUMI ORANGE 6.7MMX12CM (TIP) ×6 IMPLANT
TIP UTERINE 5.1X6CM LAV DISP (MISCELLANEOUS) IMPLANT
TIP UTERINE 6.7X10CM GRN DISP (MISCELLANEOUS) IMPLANT
TIP UTERINE 6.7X6CM WHT DISP (MISCELLANEOUS) IMPLANT
TIP UTERINE 6.7X8CM BLUE DISP (MISCELLANEOUS) IMPLANT
TOWEL OR 17X24 6PK STRL BLUE (TOWEL DISPOSABLE) ×10 IMPLANT
TRAY FOLEY CATH SILVER 14FR (SET/KITS/TRAYS/PACK) ×5 IMPLANT
TRENDGUARD 450 HYBRID PRO PACK (MISCELLANEOUS) ×5
TRENDGUARD 600 HYBRID PROC PK (MISCELLANEOUS)
TROCAR ADV FIXATION 5X100MM (TROCAR) ×8 IMPLANT
TROCAR PORT AIRSEAL 5X120 (TROCAR) ×5 IMPLANT
TROCAR XCEL NON BLADE 8MM B8LT (ENDOMECHANICALS) ×5 IMPLANT
TROCAR XCEL NON-BLD 5MMX100MML (ENDOMECHANICALS) ×2 IMPLANT
WARMER LAPAROSCOPE (MISCELLANEOUS) ×5 IMPLANT

## 2017-06-21 NOTE — Anesthesia Procedure Notes (Signed)
Procedure Name: Intubation Date/Time: 06/21/2017 10:46 AM Performed by: Justice Rocher, CRNA Pre-anesthesia Checklist: Patient identified, Emergency Drugs available, Suction available and Patient being monitored Patient Re-evaluated:Patient Re-evaluated prior to induction Oxygen Delivery Method: Circle system utilized Preoxygenation: Pre-oxygenation with 100% oxygen Induction Type: IV induction Ventilation: Mask ventilation without difficulty Laryngoscope Size: Mac and 3 Grade View: Grade II Tube type: Oral Tube size: 7.0 mm Number of attempts: 1 Airway Equipment and Method: Stylet and Oral airway Placement Confirmation: ETT inserted through vocal cords under direct vision,  positive ETCO2 and breath sounds checked- equal and bilateral Secured at: 22 cm Tube secured with: Tape Dental Injury: Teeth and Oropharynx as per pre-operative assessment

## 2017-06-21 NOTE — OR Nursing (Signed)
Specimen weighed in OR. 1644 grams.  Edyth Gunnels RN

## 2017-06-21 NOTE — Progress Notes (Signed)
Day of Surgery Procedure(s) (LRB): TOTAL LAPAROSCOPIC HYSTERECTOMY WITH SALPINGECTOMY (Bilateral) CYSTOSCOPY (N/A)  Subjective: Patient is without complaints, no pain, no nausea. Thirsty.   Objective: I have reviewed patient's vital signs and intake and output.  General: alert, cooperative and no distress Resp: clear to auscultation bilaterally Cardio: S1, S2 normal GI: soft, non-tender; bowel sounds normal; no masses,  no organomegaly Extremities: extremities normal, atraumatic, no cyanosis or edema Vaginal Bleeding: none  Assessment: s/p Procedure(s): TOTAL LAPAROSCOPIC HYSTERECTOMY WITH SALPINGECTOMY (Bilateral) CYSTOSCOPY (N/A): stable  Plan: Advance diet Encourage ambulation  Will send urine for ua, c&s (cloudy in the OR and she has a h/o frequent UTI's)  LOS: 0 days    Salvadore Dom 06/21/2017, 5:25 PM

## 2017-06-21 NOTE — Anesthesia Postprocedure Evaluation (Signed)
Anesthesia Post Note  Patient: Rebecca Mejia  Procedure(s) Performed: TOTAL LAPAROSCOPIC HYSTERECTOMY WITH SALPINGECTOMY (Bilateral Abdomen) CYSTOSCOPY (N/A Bladder)     Patient location during evaluation: PACU Anesthesia Type: General Level of consciousness: sedated and patient cooperative Pain management: pain level controlled Vital Signs Assessment: post-procedure vital signs reviewed and stable Respiratory status: spontaneous breathing Cardiovascular status: stable Anesthetic complications: no    Last Vitals:  Vitals:   06/21/17 1842 06/21/17 1928  BP: 119/72 125/69  Pulse: 71 67  Resp: 16 16  Temp: 36.6 C 37.3 C  SpO2: 96% 98%    Last Pain:  Vitals:   06/21/17 2000  TempSrc:   PainSc: 2                  Nolon Nations

## 2017-06-21 NOTE — Transfer of Care (Signed)
Immediate Anesthesia Transfer of Care Note  Patient: Rebecca Mejia  Procedure(s) Performed: Procedure(s) (LRB): TOTAL LAPAROSCOPIC HYSTERECTOMY WITH SALPINGECTOMY (Bilateral) CYSTOSCOPY (N/A)  Patient Location: PACU  Anesthesia Type: General  Level of Consciousness: awake, sedated, patient cooperative and responds to stimulation  Airway & Oxygen Therapy: Patient Spontanous Breathing and Patient connected to [place] oxygen  Post-op Assessment: Report given to PACU RN, Post -op Vital signs reviewed and stable and Patient moving all extremities  Post vital signs: Reviewed and stable  Complications: No apparent anesthesia complications

## 2017-06-21 NOTE — Anesthesia Preprocedure Evaluation (Signed)
Anesthesia Evaluation  Patient identified by MRN, date of birth, ID band Patient awake    Reviewed: Allergy & Precautions, NPO status , Patient's Chart, lab work & pertinent test results  Airway Mallampati: II  TM Distance: >3 FB Neck ROM: Full    Dental no notable dental hx.    Pulmonary neg pulmonary ROS,    Pulmonary exam normal breath sounds clear to auscultation       Cardiovascular negative cardio ROS Normal cardiovascular exam Rhythm:Regular Rate:Normal     Neuro/Psych MS  Neuromuscular disease negative psych ROS   GI/Hepatic negative GI ROS, Neg liver ROS,   Endo/Other  negative endocrine ROS  Renal/GU negative Renal ROS  negative genitourinary   Musculoskeletal negative musculoskeletal ROS (+)   Abdominal   Peds negative pediatric ROS (+)  Hematology negative hematology ROS (+)   Anesthesia Other Findings   Reproductive/Obstetrics negative OB ROS                             Anesthesia Physical Anesthesia Plan  ASA: II  Anesthesia Plan: General   Post-op Pain Management:    Induction: Intravenous  PONV Risk Score and Plan: 3 and Ondansetron, Dexamethasone, Treatment may vary due to age or medical condition and Scopolamine patch - Pre-op  Airway Management Planned: Oral ETT  Additional Equipment:   Intra-op Plan:   Post-operative Plan: Extubation in OR  Informed Consent: I have reviewed the patients History and Physical, chart, labs and discussed the procedure including the risks, benefits and alternatives for the proposed anesthesia with the patient or authorized representative who has indicated his/her understanding and acceptance.   Dental advisory given  Plan Discussed with: CRNA and Surgeon  Anesthesia Plan Comments:         Anesthesia Quick Evaluation

## 2017-06-21 NOTE — Telephone Encounter (Signed)
Faxed completed/signed PA refib to [facility] services at [phone]. Awaiting response from insurance.  Pt has been on Rebif since 2011, tolerating well and stable.

## 2017-06-21 NOTE — Op Note (Signed)
Preoperative Diagnosis: Large symptomatic fibroid uterus, right adnexal cyst  Postoperative Diagnosis: Large symptomatic fibroid uterus, right paratubal cyst  Procedure:  Total Laparoscopic Hysterectomy with bilateral salpingectomies, contained uterine morcellation with tissue extraction and cystoscopy  Surgeon: Dr Sumner Boast  Assistant: Dr Edwinna Areola  Anesthesia: General  EBL: 700 cc  Fluids: 4,000 cc LR  Urine output: 900 cc (400 cc at the beginning of the case, 500 cc during the case). Of note the urine looked cloudy, a urine sample will be sent to rule out infection  Complications: None  Indications for surgery: The patient is a 51 year old female, who presented with with a large abdominal pelvic mass. Work up included a CT scan that showed a large fibroid uterus extending into her upper abdomen. The largest myoma was 15.4 cm. She had a benign endometrial biopsy, a normal CBC and a normal pap smear. Given the very large size of her fibroid uterus she was counseled on the use of lupron to try and shrink her uterus prior to attempted laparoscopic hysterectomy. The patient declined, she desired surgery as soon as possible. She was aware of the possible need of laparotomy.  The patient is aware of the risks and complications involved with the surgery and consent was obtained prior to the procedure.  Findings: EUA: large fibroid uterus, filling her upper abdomen and a large portion, but not all of her lower abdomen. On pelvic exam the fibroids were not filling the pelvis. Laparoscopy: large pedunculated fibroid in the upper abdomen. Multiple other fibroids noted intramural and subserosal. She had normal left tube and ovary. She had a large right paratubal cyst, normal right ovary. Normal liver edge. Cystoscopy: normal bladder mucosa and bilateral ureteral jets noted.   Procedure: The patient was taken to the operating room with an IV in placed, preoperative antibiotics had been  administered (Cefazolin 2 grams was given prior to the case and a 2nd dose was given 4 hours after the first dose). She was placed in the dorsal lithotomy position. General anesthesia was administered. She was prepped and draped in the usual sterile fashion for an abdominal, vaginal surgery. A rumi uterine manipulator was placed, using a # 2.5 cup and a 12 cm extender. A foley catheter was placed.    A left upper quadrant incision was made approximately 2 cm below the rib cage in the midclavicular line. The 5 mm opti-view trocar was inserted and the abdominal cavity was insufflated with CO2. The patient was placed in trendelenburg and the abdominal pelvic cavity was inspected. 3 more trocars were placed: 1 in each lateral/mid abdomen and the last in the umbilicus. These areas were injected with 0.25% marcaine, incised with a #11 blade and all trocars were inserted with direct visualization with the laparoscope. A # 5 airseal trocar was placed in the right mid abdomen, a 5 mm trocar in the left mid quadrant and a #8 airseal in the umbilicus. The abdominal pelvic cavity was again inspected.   The right ureter was identified. The right tube/paratubal cyst was then elevated from the pelvic sidewall, cauterized and cut with the ligasure device. The mesosalpinx was cauterized and cut with the ligasure device.  The tube was separated from the uterus using the ligasure device and placed in the cul de sac to be removed later. Her uterine mobilization was very limited secondary to the large pedunculated fibroid, the decision was made to remove the fibroid from the uterus in order to proceed with laparoscopic hysterectomy. The  pedicle was injected with approximately 20 cc of a mixture of vasopressin 20 IU mixed with 100 cc of normal saline. The pedicle was transected with the harmonic. The uterine edge was bleeding, so it was sutured with a 0 V-Lock suture with excellent hemostasis. Some bleeding occurred from the  specimen, nothing was done since it was no longer attached to a blood supply. Once the large fibroid was removed, uterine mobilization markedly improved.   The right round ligament was then cauterized and cut with the ligasure device and the anterior and posterior leafs of the broad ligament were taken down with the ligasure device. The harmonic scalpel was then used to take down the bladder flap and skeltonize the vessels. The right uterine vessels were then clamped and cauterized with the ligasure device. They were not ligated at this time. Hemostasis was excellent. The same procedure was repeated on the left. The ureter was again identified prior to the disection.   Using the rumi manipulator the uterus was pushed up in the pelvic cavity and the harmonic scalpel was used to separate the cervix from the vagina using the harmonic energy. The right tube/paratubal cyst were removed vaginally. The left tube had been previously removed through the #8 trocar.   An attempt was made to place an alexis bag through the vagina to help with removal of at least the uterus, the vaginal opening wasn't sufficient for vaginal dissection. So the decision was made to proceed abdominally.   An occluder was placed in the vagina to maintain pneumoperitoneum.   The vaginal cuff was then closed with a 0 V-lock suture. Hemostasis was excellent.   The umbilical trocar was then removed and the incision was extended to approximately 3 cm. The large alexis bag was placed into the abdominal cavity. The alexis trocar was placed through the umbilicus and the large fibroid was placed in the bag. The alexis trocar and the edge of the alexis bag were pulled out of the umbilical port. The fibroid was then sharply morcellated in the bag and removed in pieces. The bag was removed intact.  A second 14 cm alexis bag was placed back into the abdominal cavity through the umbilical incision and the uterus was placed in the bag and the edges of  the bag were removed from the incision. Again contained morcellation occurred sharply in the bag. The specimen was removed and the bag was removed intact.   The specimen weighed 1,644 grams. It took 2.5 hours for the hysterectomy, then 2.5 hours to morcellate and remove the specimens. It was more than twice the amount of time for a typical laparoscopic hysterectomy.   The fascia of the umbilical incision was closed with a running stitch of 0-Vicryl.   The abdominal pelvic cavity was irrigated and suctioned dry. There was a moderate amount of blood loss, some of which was blood draining from the specimens. Pressure was released and hemostasis remained excellent. Arista was placed in the pelvis.   The trocars were removed. The abdominal cavity was desufflated and the trocars were removed. The skin was closed with subcuticular stiches of 4-0 vicryl and dermabond was placed over the incisions.  The foley catheter was removed and cystoscopy was performed using a 70 degree scope. Both ureters expelled urine, no bladder abnormalities were noted. The bladder was allowed to drain and the cystoscope was removed. A new foley catheter was placed.   The patient's abdomen and perineum were cleansed and she was taken out of the dorsal  lithotomy position. Upon awakening she was extubated and taken to the recovery room in stable condition. The sponge and instrument counts were correct.   CC: Dr Jannifer Franklin

## 2017-06-21 NOTE — Interval H&P Note (Signed)
History and Physical Interval Note:  06/21/2017 8:58 AM  Rebecca Mejia  has presented today for surgery, with the diagnosis of large uterine fibroid, right adnexal cyst  The various methods of treatment have been discussed with the patient and family. After consideration of risks, benefits and other options for treatment, the patient has consented to  Procedure(s) with comments: TOTAL LAPAROSCOPIC HYSTERECTOMY WITH SALPINGECTOMY (Bilateral) LAPAROSCOPIC OOPHORECTOMY   possible (Right) - possible LAPAROTOMY   possible  (N/A) - possible CYSTOSCOPY (N/A) as a surgical intervention .  May need to do a myomectomy in order to get the specimen out laparoscopically, discussed small risk of sarcoma. On review of her codeine allergy, she remembers being itchy, not sure about hives. States she has had medication with codeine since then and did fine. The patient's history has been reviewed, patient examined, no change in status, stable for surgery.  I have reviewed the patient's chart and labs.  Questions were answered to the patient's satisfaction.     Salvadore Dom

## 2017-06-21 NOTE — Telephone Encounter (Signed)
Received fax notification from [facility] that PA Rebif approved effective 06/21/2017-06/21/2018.  Request ID# [id number].

## 2017-06-22 ENCOUNTER — Encounter (HOSPITAL_BASED_OUTPATIENT_CLINIC_OR_DEPARTMENT_OTHER): Payer: Self-pay | Admitting: Obstetrics and Gynecology

## 2017-06-22 DIAGNOSIS — D259 Leiomyoma of uterus, unspecified: Secondary | ICD-10-CM | POA: Diagnosis not present

## 2017-06-22 LAB — CBC
HCT: 27.6 % — ABNORMAL LOW (ref 36.0–46.0)
Hemoglobin: 9.5 g/dL — ABNORMAL LOW (ref 12.0–15.0)
MCH: 31.6 pg (ref 26.0–34.0)
MCHC: 34.4 g/dL (ref 30.0–36.0)
MCV: 91.7 fL (ref 78.0–100.0)
Platelets: 173 10*3/uL (ref 150–400)
RBC: 3.01 MIL/uL — ABNORMAL LOW (ref 3.87–5.11)
RDW: 12 % (ref 11.5–15.5)
WBC: 7.5 10*3/uL (ref 4.0–10.5)

## 2017-06-22 LAB — URINE CULTURE: Culture: NO GROWTH

## 2017-06-22 MED ORDER — KETOROLAC TROMETHAMINE 30 MG/ML IJ SOLN
INTRAMUSCULAR | Status: AC
  Filled 2017-06-22: qty 1

## 2017-06-22 MED ORDER — HYDROMORPHONE HCL 1 MG/ML IJ SOLN
INTRAMUSCULAR | Status: AC
  Filled 2017-06-22: qty 1

## 2017-06-22 MED ORDER — HYDROMORPHONE HCL 2 MG PO TABS: ORAL_TABLET | ORAL | 0 refills | Status: DC

## 2017-06-22 MED ORDER — HYDROMORPHONE HCL 2 MG PO TABS
ORAL_TABLET | ORAL | Status: AC
  Filled 2017-06-22: qty 1

## 2017-06-22 MED ORDER — HYDROMORPHONE HCL 2 MG PO TABS
2.0000 mg | ORAL_TABLET | Freq: Once | ORAL | Status: AC
  Administered 2017-06-22: 2 mg via ORAL
  Filled 2017-06-22: qty 1

## 2017-06-22 MED ORDER — HYDROCODONE-ACETAMINOPHEN 5-325 MG PO TABS
ORAL_TABLET | ORAL | Status: AC
  Filled 2017-06-22: qty 2

## 2017-06-22 MED ORDER — IBUPROFEN 800 MG PO TABS: 800.0000 mg | ORAL_TABLET | Freq: Three times a day (TID) | ORAL | 0 refills | Status: DC | PRN

## 2017-06-22 NOTE — Progress Notes (Signed)
1 Day Post-Op Procedure(s) (LRB): TOTAL LAPAROSCOPIC HYSTERECTOMY WITH SALPINGECTOMY (Bilateral) CYSTOSCOPY (N/A).  Subjective: Patient reports she is doing well, tolerating po, ambulating, pain well controlled. Her biggest c/o is of shoulder pain. Abdomen is sore. Last narcotic dose was over 5 hours ago. She still has the catheter in.    Objective: I have reviewed patient's vital signs, intake and output and labs.  General: alert, cooperative and no distress Resp: clear to auscultation bilaterally Cardio: S1, S2 normal GI: soft, appropriately tender, mildly distended, NABS. Dressings are dry  Extremities: extremities normal, atraumatic, no cyanosis or edema Vaginal Bleeding: minimal  Assessment: s/p Procedure(s): TOTAL LAPAROSCOPIC HYSTERECTOMY WITH SALPINGECTOMY (Bilateral) CYSTOSCOPY (N/A): stable, progressing well and tolerating diet  Plan: Advance to PO medication Discontinue IV fluids Discharge home will remove foley. She has a h/o urinary retention and will self cath at home  LOS: 0 days    Salvadore Dom 06/22/2017, 7:51 AM

## 2017-06-23 NOTE — Discharge Summary (Signed)
Physician Discharge Summary   Patient ID: Rebecca Mejia [id number] 51 y.o. July 31, 1966  Admit date: 06/21/2017  Discharge date and time: 06/22/2017  8:40 AM   Admitting Physician: Salvadore Dom, MD   Discharge Physician: Salvadore Dom, MD  Admission Diagnoses: large uterine fibroid, right adnexal cyst  Discharge Diagnoses: Same, s/p Total laparoscopic hysterectomy, bilateral salpingectomy, cystoscopy  Admission Condition: good  Discharged Condition: good  Indication for Admission: Surgery  Hospital Course: The patient underwent an uncomplicated surgery, normal postoperative course  Consults: None  Discharge Exam:See post op day #1 progress note  Disposition: 01-Home or Self Care  Patient Instructions:  Allergies as of 06/22/2017      Reactions   Codeine Hives      Medication List    TAKE these medications   baclofen 10 MG tablet Commonly known as:  LIORESAL Take 1 tablet (10 mg total) by mouth 2 (two) times daily as needed for muscle spasms.   Cholecalciferol 2000 units Caps Commonly known as:  HM VITAMIN D3 Take 1 capsule (2,000 Units total) by mouth daily.   HYDROmorphone 2 MG tablet Commonly known as:  DILAUDID 1-2 tablets every 4-6 hours as needed for pain   ibuprofen 800 MG tablet Commonly known as:  ADVIL,MOTRIN Take 1 tablet (800 mg total) every 8 (eight) hours as needed by mouth.   INTERFERON BETA-1A Inject 22 mcg into the skin 3 (three) times a week. Patient reports taking M,W,F   modafinil 100 MG tablet Commonly known as:  PROVIGIL Take 1 tablet (100 mg total) by mouth daily. What changed:    when to take this  reasons to take this            Discharge Care Instructions  (From admission, onward)        Start     Ordered   06/22/17 0000  Discharge wound care:    Comments:  All dressings can be removed this evening   06/22/17 0803      Follow-up with Dr Talbert Nan next week  Signed: Salvadore Dom 06/23/2017 6:23 PM

## 2017-06-28 ENCOUNTER — Ambulatory Visit (INDEPENDENT_AMBULATORY_CARE_PROVIDER_SITE_OTHER): Payer: Managed Care, Other (non HMO) | Admitting: Obstetrics and Gynecology

## 2017-06-28 ENCOUNTER — Encounter: Payer: Self-pay | Admitting: Obstetrics and Gynecology

## 2017-06-28 ENCOUNTER — Other Ambulatory Visit: Payer: Self-pay

## 2017-06-28 VITALS — BP 118/70 | HR 80 | Resp 14 | Wt 123.0 lb

## 2017-06-28 DIAGNOSIS — R3915 Urgency of urination: Secondary | ICD-10-CM

## 2017-06-28 DIAGNOSIS — R350 Frequency of micturition: Secondary | ICD-10-CM | POA: Diagnosis not present

## 2017-06-28 DIAGNOSIS — Z90710 Acquired absence of both cervix and uterus: Secondary | ICD-10-CM

## 2017-06-28 DIAGNOSIS — R300 Dysuria: Secondary | ICD-10-CM

## 2017-06-28 LAB — POCT URINALYSIS DIPSTICK
Bilirubin, UA: NEGATIVE
Blood, UA: NEGATIVE
Glucose, UA: NEGATIVE
Ketones, UA: NEGATIVE
Nitrite, UA: NEGATIVE
Protein, UA: NEGATIVE
Urobilinogen, UA: NEGATIVE E.U./dL — AB
pH, UA: 6 (ref 5.0–8.0)

## 2017-06-28 MED ORDER — PHENAZOPYRIDINE HCL 200 MG PO TABS: 200.0000 mg | ORAL_TABLET | Freq: Three times a day (TID) | ORAL | 0 refills | Status: DC | PRN

## 2017-06-28 MED ORDER — SULFAMETHOXAZOLE-TRIMETHOPRIM 800-160 MG PO TABS: 1.0000 | ORAL_TABLET | Freq: Two times a day (BID) | ORAL | 0 refills | Status: DC

## 2017-06-28 NOTE — Patient Instructions (Signed)
No heavy lifting (not over 10 lbs), no working with the horses until 1 month after surgery. No intercourse for 8-12 weeks. Driving is fine

## 2017-06-28 NOTE — Progress Notes (Signed)
GYNECOLOGY  VISIT   HPI: 51 y.o.   Married  Caucasian  female   G0P0000 with Patient's last menstrual period was 05/03/2017.   here for follow up. Patient is 1 weeks s/p TLH. She has been voiding so much better since her surgery. She was catheterizing her self, but has stopped since Wednesday. She c/o mild urinary urgency, frequency and dysuria over the last few days. She has a h/o urinary retention and has frequent UTI's.  She feels so much better since the surgery. She didn't realize how much discomfort she had from her fibroid uterus. Only taking ibuprofen and tylenol. Stopped with the narcotic on Friday. She didn't have a BM with the pain meds. On Thursday she took miralax and stool softer and nothing happened. She took some magnesium citrate, now with some diarrhea, now just once a day (loose).   GYNECOLOGIC HISTORY: Patient's last menstrual period was 05/03/2017. Contraception:hysterectomy  Menopausal hormone therapy: none         OB History    Gravida Para Term Preterm AB Living   0 0 0 0 0 0   SAB TAB Ectopic Multiple Live Births   0 0 0 0 0         Patient Active Problem List   Diagnosis Date Noted  . Status post laparoscopic hysterectomy 06/21/2017  . Multiple sclerosis ([place]) 04/07/2016  . Neurogenic bladder 04/07/2016    Past Medical History:  Diagnosis Date  . MS (multiple sclerosis) ([place])   . Neurogenic bladder 04/07/2016  . Neuromuscular disorder ([place])    MS  . Rash    patient reports red, flat rash that is now "cyling out"     Past Surgical History:  Procedure Laterality Date  . ABDOMINAL HYSTERECTOMY    . arm surgery  2014   broken left wrist after fall from side of [place]ain while horse riding;  still has rods in wrist   . KNEE SURGERY  1998   reconstructive ACL   . NOSE SURGERY  2013   broken nose after fall from horse     Current Outpatient Medications  Medication Sig Dispense Refill  . baclofen (LIORESAL) 10 MG tablet Take 1 tablet (10 mg total) by  mouth 2 (two) times daily as needed for muscle spasms. 60 each 5  . Cholecalciferol (HM VITAMIN D3) 2000 units CAPS Take 1 capsule (2,000 Units total) by mouth daily. 30 each   . HYDROmorphone (DILAUDID) 2 MG tablet 1-2 tablets every 4-6 hours as needed for pain 30 tablet 0  . ibuprofen (ADVIL,MOTRIN) 800 MG tablet Take 1 tablet (800 mg total) every 8 (eight) hours as needed by mouth. 30 tablet 0  . INTERFERON BETA-1A Inject 22 mcg into the skin 3 (three) times a week. Patient reports taking M,W,F    . modafinil (PROVIGIL) 100 MG tablet Take 1 tablet (100 mg total) by mouth daily. (Patient taking differently: Take 100 mg by mouth daily as needed (usually takes 1/2 of tablet). ) 30 tablet 5   No current facility-administered medications for this visit.      ALLERGIES: Codeine  Family History  Problem Relation Age of Onset  . Heart attack Mother   . Breast cancer Sister   . Osteoporosis Sister   . Breast cancer Maternal Grandmother     Social History   Socioeconomic History  . Marital status: Married    Spouse name: Not on file  . Number of children: Not on file  . Years of education:  Not on file  . Highest education level: Not on file  Social Needs  . Financial resource strain: Not on file  . Food insecurity - worry: Not on file  . Food insecurity - inability: Not on file  . Transportation needs - medical: Not on file  . Transportation needs - non-medical: Not on file  Occupational History  . Not on file  Tobacco Use  . Smoking status: Never Smoker  . Smokeless tobacco: Never Used  Substance and Sexual Activity  . Alcohol use: Yes    Alcohol/week: 4.2 oz    Types: 7 Glasses of wine per week  . Drug use: No  . Sexual activity: Yes    Partners: Male    Birth control/protection: None    Comment: husband vasectomy   Other Topics Concern  . Not on file  Social History Narrative  . Not on file    Review of Systems  Constitutional: Negative.   HENT: Negative.   Eyes:  Negative.   Respiratory: Negative.   Cardiovascular: Negative.   Gastrointestinal: Negative.   Genitourinary: Positive for dysuria, frequency and urgency.  Musculoskeletal: Negative.   Skin: Negative.   Neurological: Negative.   Endo/Heme/Allergies: Negative.   Psychiatric/Behavioral: Negative.     PHYSICAL EXAMINATION:    BP 118/70 (BP Location: Right Arm, Patient Position: Sitting, Cuff Size: Normal)   Pulse 80   Resp 14   Wt 123 lb (55.8 kg)   LMP 05/03/2017   BMI 21.79 kg/m     General appearance: alert, cooperative and appears stated age Abdomen: soft, non-tender; non distended, no masses,  no organomegaly Incisions are healing well   Urine dip: 3+ leuks, otherwise negative  ASSESSMENT 1 week s/p TLH/BS of a very large fibroid uterus. She is feeling well C/O urinary urgency, frequency and dysuria. She has a h/o urinary retention and frequent UTI's She hasn't been self catheterizing since Wednesday    PLAN F/U in 3 weeks Will send urine for ua, c&s Treat for possible UTI Recommended she catheterize herself to make sure she is emptying appropriately (hat given)  An After Visit Summary was printed and given to the patient.

## 2017-06-29 LAB — URINALYSIS, MICROSCOPIC ONLY
Casts: NONE SEEN /lpf
Epithelial Cells (non renal): NONE SEEN /hpf (ref 0–10)
WBC, UA: >30 /hpf — AB (ref 0–?)

## 2017-06-30 LAB — URINE CULTURE

## 2017-07-01 ENCOUNTER — Telehealth: Payer: Self-pay

## 2017-07-01 NOTE — Telephone Encounter (Signed)
Spoke with patient. Results given. Patient reports she is feeling much better. Symptoms have resolved. Aware to contact the office if symptoms return. Encounter closed.

## 2017-07-01 NOTE — Telephone Encounter (Signed)
-----   Message from Salvadore Dom, MD sent at 06/30/2017  5:49 PM EST ----- Please let the patient know that she does have a UTI. It is sensitive to the antibiotic she is on. Please make sure she is feeling better.

## 2017-07-05 ENCOUNTER — Other Ambulatory Visit: Payer: Self-pay

## 2017-07-05 ENCOUNTER — Ambulatory Visit (INDEPENDENT_AMBULATORY_CARE_PROVIDER_SITE_OTHER): Payer: Managed Care, Other (non HMO) | Admitting: Obstetrics and Gynecology

## 2017-07-05 ENCOUNTER — Encounter: Payer: Self-pay | Admitting: Obstetrics and Gynecology

## 2017-07-05 ENCOUNTER — Telehealth: Payer: Self-pay | Admitting: Obstetrics and Gynecology

## 2017-07-05 VITALS — BP 112/80 | HR 68 | Temp 97.6°F | Resp 16 | Wt 125.0 lb

## 2017-07-05 DIAGNOSIS — R300 Dysuria: Secondary | ICD-10-CM

## 2017-07-05 DIAGNOSIS — R339 Retention of urine, unspecified: Secondary | ICD-10-CM

## 2017-07-05 DIAGNOSIS — G35 Multiple sclerosis: Secondary | ICD-10-CM | POA: Diagnosis not present

## 2017-07-05 DIAGNOSIS — N319 Neuromuscular dysfunction of bladder, unspecified: Secondary | ICD-10-CM

## 2017-07-05 DIAGNOSIS — R3915 Urgency of urination: Secondary | ICD-10-CM

## 2017-07-05 MED ORDER — NITROFURANTOIN MACROCRYSTAL 50 MG PO CAPS: ORAL_CAPSULE | ORAL | 0 refills | Status: DC

## 2017-07-05 MED ORDER — LIDOCAINE HCL 2 % EX GEL: 1.0000 "application " | Freq: Two times a day (BID) | CUTANEOUS | 1 refills | Status: DC

## 2017-07-05 MED ORDER — NITROFURANTOIN MONOHYD MACRO 100 MG PO CAPS: 100.0000 mg | ORAL_CAPSULE | Freq: Two times a day (BID) | ORAL | 0 refills | Status: DC

## 2017-07-05 NOTE — Progress Notes (Signed)
GYNECOLOGY  VISIT   HPI: 51 y.o.   Married  Caucasian  female   [place] with Patient's last menstrual period was 05/03/2017.   here c/o dysuria and urinary urgency X 2 days  She was treated for a UTI last week, felt better with the Bactrim. The patient has a h/o MS and bladder dysfunction. Prior to the hysterectomy she was instructed on self cath by urology. She wasn't doing it until just prior to her surgery.  When she first got out of the hospital she felt like she was voiding better. Then she got the UTI and wasn't peeing as well. She wasn't catheterizing daily. Husband was having trouble catheterizing her, getting better at it. He is only catheterizing her one x a day. She can't void prior to being catheterized, thinks it is the stress of being catheterized. Not voiding as well in the last week. She tries to void 4-5 x a day.  Her UTI symptoms started up again last night. She feels the urge to void and can't go. She is having pain from being catheterized. She has a 14 french catheter at home. No blood in her urine, but when she is catheterized there is blood in the catheter.  BM are better since she got off the surgery. Better than prior to surgery. Other than her bladder, she is feeling much better.  No fever, or flank pain.   GYNECOLOGIC HISTORY: Patient's last menstrual period was 05/03/2017. Contraception:Hysterectomy Menopausal hormone therapy: none         OB History    Gravida Para Term Preterm AB Living   0 0 0 0 0 0   SAB TAB Ectopic Multiple Live Births   0 0 0 0 0         Patient Active Problem List   Diagnosis Date Noted  . Status post laparoscopic hysterectomy 06/21/2017  . Multiple sclerosis ([place]) 04/07/2016  . Neurogenic bladder 04/07/2016    Past Medical History:  Diagnosis Date  . MS (multiple sclerosis) ([place])   . Neurogenic bladder 04/07/2016  . Neuromuscular disorder ([place])    MS  . Rash    patient reports red, flat rash that is now "cyling out"      Past Surgical History:  Procedure Laterality Date  . ABDOMINAL HYSTERECTOMY    . arm surgery  2014   broken left wrist after fall from side of [place] while horse riding;  still has rods in wrist   . CYSTOSCOPY N/A 06/21/2017   Procedure: CYSTOSCOPY;  Surgeon: Salvadore Dom, MD;  [facility];  Service: Gynecology;  Laterality: N/A;  . KNEE SURGERY  1998   reconstructive ACL   . NOSE SURGERY  2013   broken nose after fall from horse   . TOTAL LAPAROSCOPIC HYSTERECTOMY WITH SALPINGECTOMY Bilateral 06/21/2017   Procedure: TOTAL LAPAROSCOPIC HYSTERECTOMY WITH SALPINGECTOMY;  Surgeon: Salvadore Dom, MD;  Location: [facility];  Service: Gynecology;  Laterality: Bilateral;    Current Outpatient Medications  Medication Sig Dispense Refill  . baclofen (LIORESAL) 10 MG tablet Take 1 tablet (10 mg total) by mouth 2 (two) times daily as needed for muscle spasms. 60 each 5  . Cholecalciferol (HM VITAMIN D3) 2000 units CAPS Take 1 capsule (2,000 Units total) by mouth daily. 30 each   . ibuprofen (ADVIL,MOTRIN) 800 MG tablet Take 1 tablet (800 mg total) every 8 (eight) hours as needed by mouth. 30 tablet 0  . INTERFERON BETA-1A Inject 22  mcg into the skin 3 (three) times a week. Patient reports taking M,W,F    . modafinil (PROVIGIL) 100 MG tablet Take 1 tablet (100 mg total) by mouth daily. (Patient taking differently: Take 100 mg by mouth daily as needed (usually takes 1/2 of tablet). ) 30 tablet 5   No current facility-administered medications for this visit.      ALLERGIES: Codeine  Family History  Problem Relation Age of Onset  . Heart attack Mother   . Breast cancer Sister   . Osteoporosis Sister   . Breast cancer Maternal Grandmother     Social History   Socioeconomic History  . Marital status: Married    Spouse name: Not on file  . Number of children: Not on file  . Years of education: Not on file  . Highest education  level: Not on file  Social Needs  . Financial resource strain: Not on file  . Food insecurity - worry: Not on file  . Food insecurity - inability: Not on file  . Transportation needs - medical: Not on file  . Transportation needs - non-medical: Not on file  Occupational History  . Not on file  Tobacco Use  . Smoking status: Never Smoker  . Smokeless tobacco: Never Used  Substance and Sexual Activity  . Alcohol use: Yes    Alcohol/week: 4.2 oz    Types: 7 Glasses of wine per week  . Drug use: No  . Sexual activity: Yes    Partners: Male    Birth control/protection: None    Comment: husband vasectomy   Other Topics Concern  . Not on file  Social History Narrative  . Not on file    Review of Systems  Constitutional: Negative.   HENT: Negative.   Eyes: Negative.   Respiratory: Negative.   Cardiovascular: Negative.   Gastrointestinal: Negative.   Genitourinary: Positive for dysuria and urgency.  Musculoskeletal: Negative.   Skin: Negative.   Neurological: Negative.   Endo/Heme/Allergies: Negative.   Psychiatric/Behavioral: Negative.     PHYSICAL EXAMINATION:    BP 112/80 (BP Location: Right Arm, Patient Position: Sitting, Cuff Size: Normal)   Pulse 68   Temp 97.6 F (36.4 C)   Resp 16   Wt 125 lb (56.7 kg)   LMP 05/03/2017   BMI 22.14 kg/m     General appearance: alert, cooperative and appears stated age CVA: not tender Abdomen: soft, non-tender; non distended, no masses,  no organomegaly  Pelvic: External genitalia:  no lesions              Urethra:  normal appearing urethra with no masses, tenderness or lesions              Bartholins and Skenes: normal                  PVR: 800 cc, she took azo so the urine is orange  Chaperone was present for exam.  ASSESSMENT H/O MS and know voiding dysfunction, not catheterizing regularly PVR of 800 cc H/O recurrent UTI's, treated for a UTI last week, now with recurrent symptoms.     PLAN We discussed  catheterization, I gave her an 68 French catheter Will send urine for ua, c&s Will treat with macrobid 100 mg po BID for one week Will then put her on macrodantin 50 mg a day for suppression I will reach out to her Neurologist about possible medication for bladder dysfunction (I have no experience with these medications). She doesn't feel  the Urologist will prescribe medication.  I will call in lidocaine jelly to help with the pain from catheterization   An After Visit Summary was printed and given to the patient.  Over 25 minutes face to face time of which over 50% was spent in counseling.   CC: Margette Fast, MD

## 2017-07-05 NOTE — Telephone Encounter (Signed)
Patient states that she has been treated for a bladder infection and thinks she still has it.  She is requesting a refill of the antibiotic.  Patient also wants to know if it is normal to have blood in the tip of the catheter when she takes it out.

## 2017-07-05 NOTE — Telephone Encounter (Signed)
Spoke with patient. Patient states she completed Bactrim x3 days for UTI, symptoms have returned. Reports burning, urgency, and "feverish". Symptoms started last night.   Patient is s/p TLH/BSO. Reports spouse has been helping with self cathing, reports as painful and is noticing blood on the catheter.   Denies lower back pain, N/V.   Patient requesting RX for UTI, is leaving to go out of town on Thursday. Recommended OV for further evaluation, scheduled for today at 4pm with Dr. Talbert Nan. Patient verbalizes understanding and is agreeable.   Routing to provider for final review. Patient is agreeable to disposition. Will close encounter.

## 2017-07-06 LAB — URINALYSIS, MICROSCOPIC ONLY
Casts: NONE SEEN /lpf
Epithelial Cells (non renal): NONE SEEN /hpf (ref 0–10)
WBC, UA: >30 /hpf — AB (ref 0–?)

## 2017-07-07 LAB — URINE CULTURE

## 2017-07-21 ENCOUNTER — Other Ambulatory Visit: Payer: Self-pay

## 2017-07-21 ENCOUNTER — Encounter: Payer: Self-pay | Admitting: Obstetrics and Gynecology

## 2017-07-21 ENCOUNTER — Ambulatory Visit (INDEPENDENT_AMBULATORY_CARE_PROVIDER_SITE_OTHER): Payer: Managed Care, Other (non HMO) | Admitting: Obstetrics and Gynecology

## 2017-07-21 VITALS — BP 120/64 | HR 84 | Resp 14 | Wt 125.0 lb

## 2017-07-21 DIAGNOSIS — Z90710 Acquired absence of both cervix and uterus: Secondary | ICD-10-CM

## 2017-07-21 NOTE — Progress Notes (Signed)
GYNECOLOGY  VISIT   HPI: 51 y.o.   Married  Caucasian  female   G0P0000 with Patient's last menstrual period was 05/03/2017.   here for follow up. Patient is 1 month s/p TLH/BS, she had a large fibroid uterus, pathology also showed complex endometrial hyperplasia with atypia, no cancer.  She has MS and has had to self cath. Initially not self catheterizing had huge residual and developed UTI x 2. Now on daily suppression.  She is self catheterizing 2 x a day. Getting less painful to cath herself.  Void 10 oz and cath for 6 oz, one time voided 10 oz, cath 22 oz, one time cath 10 oz, 15 oz.  No abdominal pain other than very minimal. Takes ibuprofen secondary to the pain from catheterizing herself.  Prior to surgery she was having some urinary leakage, usually when she was walking, would leak large amounts. Not leaking any more. She has some constipation with her MS, no change with surgery. Takes miralax most days. No vaginal bleeding.   GYNECOLOGIC HISTORY: Patient's last menstrual period was 05/03/2017. Contraception:hysterectomy  Menopausal hormone therapy: none         OB History    Gravida Para Term Preterm AB Living   0 0 0 0 0 0   SAB TAB Ectopic Multiple Live Births   0 0 0 0 0         Patient Active Problem List   Diagnosis Date Noted  . Status post laparoscopic hysterectomy 06/21/2017  . Multiple sclerosis ([place]) 04/07/2016  . Neurogenic bladder 04/07/2016    Past Medical History:  Diagnosis Date  . MS (multiple sclerosis) ([place])   . Neurogenic bladder 04/07/2016  . Neuromuscular disorder ([place])    MS  . Rash    patient reports red, flat rash that is now "cyling out"     Past Surgical History:  Procedure Laterality Date  . ABDOMINAL HYSTERECTOMY    . arm surgery  2014   broken left wrist after fall from side of [place] while horse riding;  still has rods in wrist   . CYSTOSCOPY N/A 06/21/2017   Procedure: CYSTOSCOPY;  Surgeon: Salvadore Dom, MD;  Location:  [facility];  Service: Gynecology;  Laterality: N/A;  . KNEE SURGERY  1998   reconstructive ACL   . NOSE SURGERY  2013   broken nose after fall from horse   . TOTAL LAPAROSCOPIC HYSTERECTOMY WITH SALPINGECTOMY Bilateral 06/21/2017   Procedure: TOTAL LAPAROSCOPIC HYSTERECTOMY WITH SALPINGECTOMY;  Surgeon: Salvadore Dom, MD;  Location: [facility];  Service: Gynecology;  Laterality: Bilateral;    Current Outpatient Medications  Medication Sig Dispense Refill  . baclofen (LIORESAL) 10 MG tablet Take 1 tablet (10 mg total) by mouth 2 (two) times daily as needed for muscle spasms. 60 each 5  . Cholecalciferol (HM VITAMIN D3) 2000 units CAPS Take 1 capsule (2,000 Units total) by mouth daily. 30 each   . ibuprofen (ADVIL,MOTRIN) 800 MG tablet Take 1 tablet (800 mg total) every 8 (eight) hours as needed by mouth. 30 tablet 0  . INTERFERON BETA-1A Inject 22 mcg into the skin 3 (three) times a week. Patient reports taking M,W,F    . lidocaine (XYLOCAINE) 2 % jelly Place 1 application into the urethra 2 (two) times daily. As needed 30 mL 1  . modafinil (PROVIGIL) 100 MG tablet Take 1 tablet (100 mg total) by mouth daily. (Patient taking differently: Take 100 mg by mouth daily as  needed (usually takes 1/2 of tablet). ) 30 tablet 5  . nitrofurantoin (MACRODANTIN) 50 MG capsule After you complete the macrobid 100 mg tablets for one week (separate script), then start taking one tablet a day every day 90 capsule 0   No current facility-administered medications for this visit.      ALLERGIES: Codeine  Family History  Problem Relation Age of Onset  . Heart attack Mother   . Breast cancer Sister   . Osteoporosis Sister   . Breast cancer Maternal Grandmother     Social History   Socioeconomic History  . Marital status: Married    Spouse name: Not on file  . Number of children: Not on file  . Years of education: Not on file  . Highest education level: Not on file   Social Needs  . Financial resource strain: Not on file  . Food insecurity - worry: Not on file  . Food insecurity - inability: Not on file  . Transportation needs - medical: Not on file  . Transportation needs - non-medical: Not on file  Occupational History  . Not on file  Tobacco Use  . Smoking status: Never Smoker  . Smokeless tobacco: Never Used  Substance and Sexual Activity  . Alcohol use: Yes    Alcohol/week: 4.2 oz    Types: 7 Glasses of wine per week  . Drug use: No  . Sexual activity: Yes    Partners: Male    Birth control/protection: None    Comment: husband vasectomy   Other Topics Concern  . Not on file  Social History Narrative  . Not on file    Review of Systems  Constitutional: Negative.   HENT: Negative.   Eyes: Negative.   Respiratory: Negative.   Cardiovascular: Negative.   Gastrointestinal: Negative.   Genitourinary: Negative.   Musculoskeletal: Negative.   Skin: Negative.   Neurological: Negative.   Endo/Heme/Allergies: Negative.   Psychiatric/Behavioral: Negative.     PHYSICAL EXAMINATION:    BP 120/64 (BP Location: Right Arm, Patient Position: Sitting, Cuff Size: Normal)   Pulse 84   Resp 14   Wt 125 lb (56.7 kg)   LMP 05/03/2017   BMI 22.14 kg/m     General appearance: alert, cooperative and appears stated age Abdomen: soft, non-tender; non distended, no masses,  no organomegaly. Incisions are well healed  Pelvic: External genitalia:  no lesions              Urethra:  normal appearing urethra with no masses, tenderness or lesions              Bartholins and Skenes: normal                 Vagina: normal appearing vagina with normal color and discharge, no lesions              Cervix: absent and cuff healing well              Bimanual Exam:  Uterus:  uterus absent              Adnexa: no mass, fullness, tenderness                Chaperone was present for exam.  ASSESSMENT Post op s/p TLH/BS, healing very well She has MS and has  had issues with urinary retention. She is finally able to self cath and is doing this 2 x a day (mostly with PVR's of 300-500 cc, one time more)  PLAN I recommend she continue on the macrodantin for UTI suppression  Routine f/u  Call with any concerns Avoid intercourse until 8-12 weeks post op.    An After Visit Summary was printed and given to the patient.

## 2017-08-03 ENCOUNTER — Other Ambulatory Visit: Payer: Self-pay | Admitting: Neurology

## 2017-08-03 NOTE — Telephone Encounter (Signed)
Faxed printed/signed rx modafinil x3 to [facility] at [phone]. Failed all three times.   I called pharmacy and spoke with Pam. She verified they have been receiving faxes. No need to re-fax.

## 2018-01-23 ENCOUNTER — Telehealth: Payer: Self-pay | Admitting: Neurology

## 2018-01-23 NOTE — Telephone Encounter (Signed)
Called pt back. Advised her last appt was 04/19/17. Dr. Jannifer Franklin likes her to f/u every 6 months. Made appt for 01/26/18 at [place] pt to bring updated insurance cards, copay and med list with her to appt. She has enough baclofen to get her until appt. Advised refill request will be addressed at appt with Dr. Jannifer Franklin.  She verbalized understanding.   I asked how she was doing. She states she fell off horse a couple weeks ago and broke one of her ribs and fractured another 2 ribs. Does not like to take pain pills. Takes advil and muscle relaxer. She is doing okay otherwise.

## 2018-01-23 NOTE — Telephone Encounter (Signed)
Patient requesting refill of baclofen (LIORESAL) 10 MG tablet sent to [facility] on [address].

## 2018-01-26 ENCOUNTER — Ambulatory Visit: Payer: Managed Care, Other (non HMO) | Admitting: Neurology

## 2018-01-26 ENCOUNTER — Encounter: Payer: Self-pay | Admitting: Neurology

## 2018-01-26 VITALS — BP 127/81 | HR 71 | Ht 63.0 in | Wt 131.5 lb

## 2018-01-26 DIAGNOSIS — N319 Neuromuscular dysfunction of bladder, unspecified: Secondary | ICD-10-CM

## 2018-01-26 DIAGNOSIS — G35 Multiple sclerosis: Secondary | ICD-10-CM

## 2018-01-26 MED ORDER — MODAFINIL 100 MG PO TABS: 100.0000 mg | ORAL_TABLET | Freq: Every day | ORAL | 1 refills | Status: DC

## 2018-01-26 MED ORDER — BACLOFEN 10 MG PO TABS: 10.0000 mg | ORAL_TABLET | Freq: Two times a day (BID) | ORAL | 3 refills | Status: DC | PRN

## 2018-01-26 NOTE — Progress Notes (Signed)
Reason for visit: Multiple sclerosis  Rebecca Mejia is an 52 y.o. female  History of present illness:  Rebecca Mejia is a 52 year old right-handed white female with a history of multiple sclerosis and a neurogenic bladder.  The patient is on low-dose Rebif, she tolerates this very well.  She has not had any new events of numbness, weakness, balance changes, or vision alteration since last seen.  She had MRI of the brain done in the summer 2018, this showed minimal white matter changes, good stability was seen.  The patient has had improvement in her bladder function following a hysterectomy for fibroids that was done in November 2018.  The patient was performing in and out catheterizations, but now she has been able to stop doing this, she was able to void the bladder on her own.  She recently fell off a horse and fractured 2 ribs on the left side, she is recovering from this.  She returns for an evaluation.  Past Medical History:  Diagnosis Date  . MS (multiple sclerosis) ([place])   . Neurogenic bladder 04/07/2016  . Neuromuscular disorder ([place])    MS  . Rash    patient reports red, flat rash that is now "cyling out"     Past Surgical History:  Procedure Laterality Date  . ABDOMINAL HYSTERECTOMY    . arm surgery  2014   broken left wrist after fall from side of motunain while horse riding;  still has rods in wrist   . CYSTOSCOPY N/A 06/21/2017   Procedure: CYSTOSCOPY;  Surgeon: Salvadore Dom, MD;  Location: [facility];  Service: Gynecology;  Laterality: N/A;  . KNEE SURGERY  1998   reconstructive ACL   . NOSE SURGERY  2013   broken nose after fall from horse   . TOTAL LAPAROSCOPIC HYSTERECTOMY WITH SALPINGECTOMY Bilateral 06/21/2017   Procedure: TOTAL LAPAROSCOPIC HYSTERECTOMY WITH SALPINGECTOMY;  Surgeon: Salvadore Dom, MD;  Location: [facility];  Service: Gynecology;  Laterality: Bilateral;    Family History  Problem Relation Age of  Onset  . Heart attack Mother   . Breast cancer Sister   . Osteoporosis Sister   . Breast cancer Maternal Grandmother     Social history:  reports that she has never smoked. She has never used smokeless tobacco. She reports that she drinks about 4.2 oz of alcohol per week. She reports that she does not use drugs.    Allergies  Allergen Reactions  . Codeine Hives and Itching    Medications:  Prior to Admission medications   Medication Sig Start Date End Date Taking? Authorizing Provider  baclofen (LIORESAL) 10 MG tablet Take 1 tablet (10 mg total) by mouth 2 (two) times daily as needed for muscle spasms. 01/26/18  Yes Kathrynn Ducking, MD  Cholecalciferol (HM VITAMIN D3) 2000 units CAPS Take 1 capsule (2,000 Units total) by mouth daily. 04/07/16  Yes Kathrynn Ducking, MD  INTERFERON BETA-1A Inject 22 mcg into the skin 3 (three) times a week. Patient reports taking M,W,F   Yes [provider]  modafinil (PROVIGIL) 100 MG tablet Take 1 tablet (100 mg total) by mouth daily. 01/26/18   Kathrynn Ducking, MD    ROS:  Out of a complete 14 system review of symptoms, the patient complains only of the following symptoms, and all other reviewed systems are negative.  Urinary frequency  Blood pressure 127/81, pulse 71, height 5\' 3"  (1.6 m), weight 131 lb 8 oz (  59.6 kg), last menstrual period 05/03/2017.  Physical Exam  General: The patient is alert and cooperative at the time of the examination.  Skin: No significant peripheral edema is noted.   Neurologic Exam  Mental status: The patient is alert and oriented x 3 at the time of the examination. The patient has apparent normal recent and remote memory, with an apparently normal attention span and concentration ability.   Cranial nerves: Facial symmetry is present. Speech is normal, no aphasia or dysarthria is noted. Extraocular movements are full. Visual fields are full.  Pupils are equal, round, and reactive to light.  Discs  are flat bilaterally.  Motor: The patient has good strength in all 4 extremities.  Sensory examination: Soft touch sensation is symmetric on the face, arms, and legs.  Coordination: The patient has good finger-nose-finger and heel-to-shin bilaterally.  Gait and station: The patient has a normal gait. Tandem gait is normal. Romberg is negative. No drift is seen.  Reflexes: Deep tendon reflexes are symmetric.   Assessment/Plan:  1.  Multiple sclerosis  2.  Neurogenic bladder  The patient has done quite well on the Rebif.  She will continue this medication, she was given a prescription for the baclofen and Provigil.  The patient will follow-up in 1 year, sooner if needed.  She has been quite stable neurologically for several years.  Jill Alexanders MD 01/26/2018 10:33 AM  [facility] [address] [place], [place] [address]  Phone [phone] Fax [phone]

## 2018-02-01 ENCOUNTER — Encounter: Payer: Self-pay | Admitting: Family Medicine

## 2018-02-01 ENCOUNTER — Ambulatory Visit: Payer: Managed Care, Other (non HMO) | Admitting: Family Medicine

## 2018-02-01 VITALS — BP 120/83 | HR 67 | Temp 98.3°F | Resp 12 | Ht 63.0 in | Wt 130.5 lb

## 2018-02-01 DIAGNOSIS — H6121 Impacted cerumen, right ear: Secondary | ICD-10-CM | POA: Diagnosis not present

## 2018-02-01 NOTE — Patient Instructions (Addendum)
Rebecca Mejia I have seen you today for an acute visit.  A few things to remember from today's visit:   Hearing loss of right ear due to cerumen impaction   Earwax Buildup, Adult The ears produce a substance called earwax that helps keep bacteria out of the ear and protects the skin in the ear canal. Occasionally, earwax can build up in the ear and cause discomfort or hearing loss. What increases the risk? This condition is more likely to develop in people who:  Are female.  Are elderly.  Naturally produce more earwax.  Clean their ears often with cotton swabs.  Use earplugs often.  Use in-ear headphones often.  Wear hearing aids.  Have narrow ear canals.  Have earwax that is overly thick or sticky.  Have eczema.  Are dehydrated.  Have excess hair in the ear canal.  What are the signs or symptoms? Symptoms of this condition include:  Reduced or muffled hearing.  A feeling of fullness in the ear or feeling that the ear is plugged.  Fluid coming from the ear.  Ear pain.  Ear itch.  Ringing in the ear.  Coughing.  An obvious piece of earwax that can be seen inside the ear canal.  How is this diagnosed? This condition may be diagnosed based on:  Your symptoms.  Your medical history.  An ear exam. During the exam, your health care provider will look into your ear with an instrument called an otoscope.  You may have tests, including a hearing test. How is this treated? This condition may be treated by:  Using ear drops to soften the earwax.  Having the earwax removed by a health care provider. The health care provider may: ? Flush the ear with water. ? Use an instrument that has a loop on the end (curette). ? Use a suction device.  Surgery to remove the wax buildup. This may be done in severe cases.  Follow these instructions at home:  Take over-the-counter and prescription medicines only as told by your health care provider.  Do not  put any objects, including cotton swabs, into your ear. You can clean the opening of your ear canal with a washcloth or facial tissue.  Follow instructions from your health care provider about cleaning your ears. Do not over-clean your ears.  Drink enough fluid to keep your urine clear or pale yellow. This will help to thin the earwax.  Keep all follow-up visits as told by your health care provider. If earwax builds up in your ears often or if you use hearing aids, consider seeing your health care provider for routine, preventive ear cleanings. Ask your health care provider how often you should schedule your cleanings.  If you have hearing aids, clean them according to instructions from the manufacturer and your health care provider. Contact a health care provider if:  You have ear pain.  You develop a fever.  You have blood, pus, or other fluid coming from your ear.  You have hearing loss.  You have ringing in your ears that does not go away.  Your symptoms do not improve with treatment.  You feel like the room is spinning (vertigo). Summary  Earwax can build up in the ear and cause discomfort or hearing loss.  The most common symptoms of this condition include reduced or muffled hearing and a feeling of fullness in the ear or feeling that the ear is plugged.  This condition may be diagnosed based on your symptoms,  your medical history, and an ear exam.  This condition may be treated by using ear drops to soften the earwax or by having the earwax removed by a health care provider.  Do not put any objects, including cotton swabs, into your ear. You can clean the opening of your ear canal with a washcloth or facial tissue. This information is not intended to replace advice given to you by your health care provider. Make sure you discuss any questions you have with your health care provider. Document Released: 09/09/2004 Document Revised: 10/13/2016 Document Reviewed:  10/13/2016 Elsevier Interactive Patient Education  [employer].

## 2018-02-01 NOTE — Progress Notes (Signed)
ACUTE VISIT  HPI:  Chief Complaint  Patient presents with  . Right ear clogged    Rebecca Mejia is a 52 y.o.female here today complaining of 3 weeks of right ear hearing loss. This is a new problem.  During visit with her neurologist he look into right ear and told her that it was "clogged" and he did not have the equipment remove cerumen. He recommended OTC products, which she used but they did not help.  No exacerbating or alleviating factors.  She uses Q-tips occasionally and she is very "careful" when she does so. No earache or ear drainage.    Ear Fullness   There is pain in the right ear. This is a new problem. The current episode started 1 to 4 weeks ago. The problem occurs constantly. The problem has been unchanged. There has been no fever. The patient is experiencing no pain. Associated symptoms include hearing loss. Pertinent negatives include no abdominal pain, coughing, ear discharge, headaches, rash, rhinorrhea, sore throat or vomiting. She has tried ear drops for the symptoms. The treatment provided no relief. There is no history of a chronic ear infection or a tympanostomy tube.   No recent URI or travel.     Review of Systems  Constitutional: Negative for activity change, appetite change, chills and fever.  HENT: Positive for hearing loss. Negative for congestion, ear discharge, ear pain, mouth sores, rhinorrhea, sinus pressure and sore throat.   Respiratory: Negative for cough, shortness of breath and wheezing.   Gastrointestinal: Negative for abdominal pain, nausea and vomiting.       No changes in bowel habits.  Musculoskeletal: Negative for back pain.  Skin: Negative for rash.  Allergic/Immunologic: Negative for environmental allergies.  Neurological: Negative for dizziness, weakness and headaches.  Hematological: Negative for adenopathy. Does not bruise/bleed easily.      Current Outpatient Medications on File Prior to Visit    Medication Sig Dispense Refill  . baclofen (LIORESAL) 10 MG tablet Take 1 tablet (10 mg total) by mouth 2 (two) times daily as needed for muscle spasms. 180 each 3  . Cholecalciferol (HM VITAMIN D3) 2000 units CAPS Take 1 capsule (2,000 Units total) by mouth daily. 30 each   . INTERFERON BETA-1A Inject 22 mcg into the skin 3 (three) times a week. Patient reports taking M,W,F    . modafinil (PROVIGIL) 100 MG tablet Take 1 tablet (100 mg total) by mouth daily. 90 tablet 1   No current facility-administered medications on file prior to visit.      Past Medical History:  Diagnosis Date  . MS (multiple sclerosis) ([place])   . Neurogenic bladder 04/07/2016  . Neuromuscular disorder ([place])    MS  . Rash    patient reports red, flat rash that is now "cyling out"    Allergies  Allergen Reactions  . Codeine Hives and Itching    Social History   Socioeconomic History  . Marital status: Married    Spouse name: Not on file  . Number of children: Not on file  . Years of education: Not on file  . Highest education level: Not on file  Occupational History  . Not on file  Social Needs  . Financial resource strain: Not on file  . Food insecurity:    Worry: Not on file    Inability: Not on file  . Transportation needs:    Medical: Not on file    Non-medical: Not on file  Tobacco Use  . Smoking status: Never Smoker  . Smokeless tobacco: Never Used  Substance and Sexual Activity  . Alcohol use: Yes    Alcohol/week: 4.2 oz    Types: 7 Glasses of wine per week  . Drug use: No  . Sexual activity: Yes    Partners: Male    Birth control/protection: None    Comment: husband vasectomy   Lifestyle  . Physical activity:    Days per week: Not on file    Minutes per session: Not on file  . Stress: Not on file  Relationships  . Social connections:    Talks on phone: Not on file    Gets together: Not on file    Attends religious service: Not on file    Active member of club or  organization: Not on file    Attends meetings of clubs or organizations: Not on file    Relationship status: Not on file  Other Topics Concern  . Not on file  Social History Narrative  . Not on file    Vitals:   02/01/18 1619  BP: 120/83  Pulse: 67  Resp: 12  Temp: 98.3 F (36.8 C)  SpO2: 97%   Body mass index is 23.12 kg/m.   Physical Exam  Nursing note and vitals reviewed. Constitutional: She is oriented to person, place, and time. She appears well-developed and well-nourished. She does not appear ill. No distress.  HENT:  Head: Normocephalic and atraumatic.  Right Ear: External ear normal.  Left Ear: Tympanic membrane, external ear and ear canal normal.  Mouth/Throat: Oropharynx is clear and moist and mucous membranes are normal.  Eyes: Conjunctivae are normal.  Respiratory: Effort normal and breath sounds normal. No respiratory distress.  Lymphadenopathy:       Head (right side): No submandibular, no preauricular and no posterior auricular adenopathy present.       Head (left side): No submandibular, no preauricular and no posterior auricular adenopathy present.    She has no cervical adenopathy.  Neurological: She is alert and oriented to person, place, and time. She has normal strength. Gait normal.  Skin: Skin is warm. No rash noted. No erythema.  Psychiatric: She has a normal mood and affect. Her speech is normal.  Well groomed, good eye contact.      ASSESSMENT AND PLAN:   Ms. Rebecca Mejia was seen today for right ear clogged.  Diagnoses and all orders for this visit:  Hearing loss of right ear due to cerumen impaction   After describing procedure and instructed about risk, she would like to proceed with ear lavage. She tolerated procedure well, no complications. Hearing loss resolved. TM is not erythematous. Avoid Q-tips. Follow-up as needed.      Sloan Takagi G. [place], MD  [facility]. [place] office.

## 2018-05-09 ENCOUNTER — Other Ambulatory Visit: Payer: Self-pay | Admitting: Neurology

## 2018-05-25 ENCOUNTER — Other Ambulatory Visit: Payer: Self-pay | Admitting: Neurology

## 2018-05-25 MED ORDER — MODAFINIL 100 MG PO TABS: 100.0000 mg | ORAL_TABLET | Freq: Every day | ORAL | 1 refills | Status: DC

## 2018-05-25 NOTE — Telephone Encounter (Signed)
Registry checked. Last fill date is 02/21/18 for #30. Next OV is on 01/30/19.

## 2018-05-25 NOTE — Telephone Encounter (Signed)
Pt requesting refills for modafinil (PROVIGIL) 100 MG tablet sent to [facility]

## 2018-05-25 NOTE — Telephone Encounter (Signed)
Faxed signed Rx to pharmacy.

## 2018-05-26 ENCOUNTER — Ambulatory Visit: Payer: Managed Care, Other (non HMO) | Admitting: Family Medicine

## 2018-05-26 ENCOUNTER — Encounter: Payer: Self-pay | Admitting: Family Medicine

## 2018-05-26 VITALS — BP 124/83 | HR 65 | Temp 97.8°F | Resp 12 | Ht 63.0 in | Wt 131.0 lb

## 2018-05-26 DIAGNOSIS — R8290 Unspecified abnormal findings in urine: Secondary | ICD-10-CM

## 2018-05-26 DIAGNOSIS — G35 Multiple sclerosis: Secondary | ICD-10-CM

## 2018-05-26 DIAGNOSIS — N319 Neuromuscular dysfunction of bladder, unspecified: Secondary | ICD-10-CM | POA: Diagnosis not present

## 2018-05-26 LAB — POCT URINALYSIS DIPSTICK
Bilirubin, UA: NEGATIVE
Blood, UA: NEGATIVE
Glucose, UA: NEGATIVE
Ketones, UA: NEGATIVE
Nitrite, UA: NEGATIVE
Odor: NEGATIVE
Protein, UA: NEGATIVE
Spec Grav, UA: <=1.005 — AB (ref 1.010–1.025)
Urobilinogen, UA: 0.2 E.U./dL
pH, UA: 6 (ref 5.0–8.0)

## 2018-05-26 NOTE — Patient Instructions (Signed)
A few things to remember from today's visit:   Cloudy urine - Plan: POC Urinalysis Dipstick, Urine Culture  Neurogenic bladder  Multiple sclerosis ([place])  I think you need to start cath again.   Adequate fluid intake, avoid holding urine for long hours, and over the counter Vit C OR cranberry capsules might help.   Seek immediate medical attention if severe abdominal pain, vomiting, fever/chills, or worsening symptoms.

## 2018-05-26 NOTE — Progress Notes (Signed)
HPI:  Chief Complaint  Patient presents with  . Cloudy urine    urine cloudy with an odor started about 4 months ago    Rebecca Mejia is a 52 y.o. female, who is here today complaining of at least 4 months of urinary symptoms.  She is concerned about cloudy urine, sometimes also odorous. Prpblem has been a stable.  Dysuria: Denies Urinary frequency: Denies Urinary urgency: Denies Incontinence: Denies Hematuria: Denies  Abdominal pain: Denies Nausea or vomiting: Denies Abnormal vaginal bleeding or discharge: Denies  LMP: S/P hysterectomy. Sexual activity: Yes Hx of UTI: She is not sure.  She has history of MS and neurogenic bladder. According to patient, she was followed with urologist and bladder catheterization was recommended.  She stopped urine cath a few months ago because she was getting< 250 ml of urine at the time, so thought she did not need to continue it.  OTC medications for this problem: None.    Review of Systems  Constitutional: Positive for fatigue (No more than usual.). Negative for activity change, appetite change and fever.  Cardiovascular: Negative for leg swelling.  Gastrointestinal: Negative for abdominal pain, nausea and vomiting.       No changes in bowel habits.  Genitourinary: Negative for decreased urine volume, dysuria, frequency, hematuria, pelvic pain, urgency, vaginal bleeding and vaginal discharge.  Musculoskeletal: Negative for back pain and myalgias.  Psychiatric/Behavioral: Negative for confusion. The patient is nervous/anxious.       Current Outpatient Medications on File Prior to Visit  Medication Sig Dispense Refill  . baclofen (LIORESAL) 10 MG tablet Take 1 tablet (10 mg total) by mouth 2 (two) times daily as needed for muscle spasms. 180 each 3  . modafinil (PROVIGIL) 100 MG tablet Take 1 tablet (100 mg total) by mouth daily. 90 tablet 1  . REBIF 22 MCG/0.5ML SOSY INJECT 22CMG SUBCUTANEOUSLY THREE TIMES A WEEK 12  Syringe 11   No current facility-administered medications on file prior to visit.      Past Medical History:  Diagnosis Date  . MS (multiple sclerosis) ([place])   . Neurogenic bladder 04/07/2016  . Neuromuscular disorder ([place])    MS  . Rash    patient reports red, flat rash that is now "cyling out"    Allergies  Allergen Reactions  . Codeine Hives and Itching    Social History   Socioeconomic History  . Marital status: Married    Spouse name: Not on file  . Number of children: Not on file  . Years of education: Not on file  . Highest education level: Not on file  Occupational History  . Not on file  Social Needs  . Financial resource strain: Not on file  . Food insecurity:    Worry: Not on file    Inability: Not on file  . Transportation needs:    Medical: Not on file    Non-medical: Not on file  Tobacco Use  . Smoking status: Never Smoker  . Smokeless tobacco: Never Used  Substance and Sexual Activity  . Alcohol use: Yes    Alcohol/week: 7.0 standard drinks    Types: 7 Glasses of wine per week  . Drug use: No  . Sexual activity: Yes    Partners: Male    Birth control/protection: None    Comment: husband vasectomy   Lifestyle  . Physical activity:    Days per week: Not on file    Minutes per session: Not on file  .  Stress: Not on file  Relationships  . Social connections:    Talks on phone: Not on file    Gets together: Not on file    Attends religious service: Not on file    Active member of club or organization: Not on file    Attends meetings of clubs or organizations: Not on file    Relationship status: Not on file  Other Topics Concern  . Not on file  Social History Narrative  . Not on file    Vitals:   05/26/18 1006  BP: 124/83  Pulse: 65  Resp: 12  Temp: 97.8 F (36.6 C)  SpO2: 98%   Body mass index is 23.21 kg/m.    Physical Exam  Nursing note and vitals reviewed. Constitutional: She is oriented to person, place, and time. She  appears well-developed and well-nourished. No distress.  HENT:  Head: Normocephalic and atraumatic.  Mouth/Throat: Oropharynx is clear and moist and mucous membranes are normal.  Eyes: Conjunctivae are normal.  Cardiovascular: Normal rate and regular rhythm.  Respiratory: Effort normal and breath sounds normal. No respiratory distress.  GI: Soft. She exhibits no mass. There is no tenderness. There is no CVA tenderness.  Musculoskeletal: She exhibits no edema.  Neurological: She is alert and oriented to person, place, and time. She has normal strength. Gait normal.  Skin: Skin is warm. No rash noted. No erythema.  Psychiatric: Her mood appears anxious.  Well-groomed, good eye contact.    ASSESSMENT AND PLAN:   Rebecca Mejia was seen today for cloudy urine.  Diagnoses and all orders for this visit:  Cloudy urine  We discussed possible etiologies. Symptoms do not suggest UTI.       Further recommendation will be given according to urine culture. Adequate hydration.  -     POC Urinalysis Dipstick -     Urine Culture  Neurogenic bladder  She may need to urine cath again. Continue following with urologist.  Multiple sclerosis ([place])  Stable symptoms. Continue following with neurologist.     Betty G. Martinique, MD  [facility]. [place] office.

## 2018-05-28 LAB — URINE CULTURE
MICRO NUMBER:: 91225751
SPECIMEN QUALITY:: ADEQUATE

## 2018-05-31 ENCOUNTER — Other Ambulatory Visit: Payer: Self-pay | Admitting: *Deleted

## 2018-05-31 MED ORDER — CIPROFLOXACIN HCL 250 MG PO TABS: 250.0000 mg | ORAL_TABLET | Freq: Two times a day (BID) | ORAL | 0 refills | Status: DC

## 2018-06-20 ENCOUNTER — Other Ambulatory Visit: Payer: Self-pay

## 2018-06-20 ENCOUNTER — Encounter: Payer: Self-pay | Admitting: Family Medicine

## 2018-06-20 ENCOUNTER — Ambulatory Visit: Payer: Managed Care, Other (non HMO) | Admitting: Family Medicine

## 2018-06-20 VITALS — BP 124/80 | HR 75 | Temp 97.9°F | Ht 63.0 in | Wt 130.6 lb

## 2018-06-20 DIAGNOSIS — R350 Frequency of micturition: Secondary | ICD-10-CM | POA: Diagnosis not present

## 2018-06-20 LAB — POCT URINALYSIS DIPSTICK
Bilirubin, UA: NEGATIVE
Blood, UA: POSITIVE
Glucose, UA: NEGATIVE
Ketones, UA: NEGATIVE
Nitrite, UA: NEGATIVE
Protein, UA: POSITIVE — AB
Spec Grav, UA: <=1.005 — AB (ref 1.010–1.025)
Urobilinogen, UA: 0.2 E.U./dL
pH, UA: 6.5 (ref 5.0–8.0)

## 2018-06-20 MED ORDER — CIPROFLOXACIN HCL 250 MG PO TABS: 250.0000 mg | ORAL_TABLET | Freq: Two times a day (BID) | ORAL | 0 refills | Status: DC

## 2018-06-20 NOTE — Progress Notes (Signed)
  Subjective:     Patient ID: Rebecca Mejia, female   DOB: January 24, 1966, 52 y.o.   MRN: [id number]  HPI Patient is seen as a work in with concern for recurrent UTI symptoms.  She was seen here several weeks ago and had Klebsiella UTI which was treated with Cipro and her symptoms fully resolved.  She just got back though from a 10-day camping trip to [place].  She states that because of her MS she had some difficulty urinating when the route without restrooms and she frequently held her urine sometimes for several hours.  She has not had a fever.  She is had some vague low back pain.  No flank pain.  No nausea or vomiting.  No gross hematuria.  Recent Klebsiella UTI that was resistant to ampicillin and Macrobid.  Past Medical History:  Diagnosis Date  . MS (multiple sclerosis) ([place])   . Neurogenic bladder 04/07/2016  . Neuromuscular disorder ([place])    MS  . Rash    patient reports red, flat rash that is now "cyling out"    Past Surgical History:  Procedure Laterality Date  . ABDOMINAL HYSTERECTOMY    . arm surgery  2014   broken left wrist after fall from side of motunain while horse riding;  still has rods in wrist   . CYSTOSCOPY N/A 06/21/2017   Procedure: CYSTOSCOPY;  Surgeon: Salvadore Dom, MD;  Location: [facility];  Service: Gynecology;  Laterality: N/A;  . KNEE SURGERY  1998   reconstructive ACL   . NOSE SURGERY  2013   broken nose after fall from horse   . TOTAL LAPAROSCOPIC HYSTERECTOMY WITH SALPINGECTOMY Bilateral 06/21/2017   Procedure: TOTAL LAPAROSCOPIC HYSTERECTOMY WITH SALPINGECTOMY;  Surgeon: Salvadore Dom, MD;  Location: [facility];  Service: Gynecology;  Laterality: Bilateral;    reports that she has never smoked. She has never used smokeless tobacco. She reports that she drinks about 7.0 standard drinks of alcohol per week. She reports that she does not use drugs. family history includes Breast cancer in her maternal  grandmother and sister; Heart attack in her mother; Osteoporosis in her sister. Allergies  Allergen Reactions  . Codeine Hives and Itching     Review of Systems  Constitutional: Negative for appetite change, chills and fever.  Gastrointestinal: Negative for abdominal pain, constipation, diarrhea, nausea and vomiting.  Genitourinary: Positive for dysuria and frequency. Negative for flank pain.  Neurological: Negative for dizziness.       Objective:   Physical Exam  Constitutional: She appears well-developed and well-nourished.  Cardiovascular: Normal rate and regular rhythm.  Pulmonary/Chest: Effort normal and breath sounds normal.       Assessment:     Recurrent urinary symptoms.  Urine dipstick suggest likely recurrent UTI with positive leukocytes and blood.    Plan:     -Urine culture sent -Plenty of fluids -Start back Cipro 250 mg twice daily for 5 days pending culture results -Follow-up immediately for any fever or worsening symptoms  Eulas Post MD [facility]

## 2018-06-20 NOTE — Patient Instructions (Signed)

## 2018-06-22 LAB — URINE CULTURE
MICRO NUMBER:: 91330648
SPECIMEN QUALITY:: ADEQUATE

## 2018-07-03 ENCOUNTER — Encounter: Payer: Self-pay | Admitting: Neurology

## 2018-07-03 ENCOUNTER — Telehealth: Payer: Self-pay | Admitting: Neurology

## 2018-07-03 NOTE — Telephone Encounter (Signed)
I called the patient.  The patient indicates that she has been on Provigil since January 2012.  She was getting this through Dr. Baltazar Najjar.  I do not have medical documentation back that far, but I do see a note from June 2015, the Provigil is listed as 1 of her medications.  I will dictate a letter in this regard.  The class action lawsuit indicates that the manufactures of Provigil delayed introduction of cheaper generic products inappropriately.

## 2018-07-03 NOTE — Telephone Encounter (Signed)
Patient called and stated that she received a letter in the mail regarding a class action lawsuit on the drug Rx. Provigil. She wants to know if our office can right a letter stating she is on this medication for her. She has also been under modafinil. Please call and advise.

## 2018-07-05 ENCOUNTER — Telehealth: Payer: Self-pay | Admitting: *Deleted

## 2018-07-05 NOTE — Telephone Encounter (Signed)
PA for Rebif 22mcg 3 times weekly completed via CoverMyMeds.  Dx: RRMS (G35). No tried and faileds. She has been stable on Rebif since dx. and tolerates it well.  Changing medications at this time would put her at risk for relapse/permanent disability.  PA was approved for dates 05/09/18 thru 05/09/19.  Pt. ID: [id number].  Request ID: [id number]

## 2018-07-28 ENCOUNTER — Telehealth: Payer: Self-pay | Admitting: Obstetrics and Gynecology

## 2018-07-28 NOTE — Telephone Encounter (Signed)
Patient is inquiring about when she needs to return for follow up ?

## 2018-07-28 NOTE — Telephone Encounter (Signed)
Spoke with patient. S/p TLH 06/21/2017. Patient has not had AEX with PCP, asking when next f/u is with Dr. Talbert Nan.   Advised AEX still recommended after hysterectomy. AEX covers overall GYN health, such as bone health, breast and pelvic exam, menopause.   Patient request to schedule AEX. Denies any current GYN concerns. AEX scheduled for 09/13/18 at 3pm.   Routing to provider for final review. Patient is agreeable to disposition. Will close encounter.

## 2018-09-13 ENCOUNTER — Other Ambulatory Visit: Payer: Self-pay

## 2018-09-13 ENCOUNTER — Ambulatory Visit: Payer: Managed Care, Other (non HMO) | Admitting: Obstetrics and Gynecology

## 2018-09-13 ENCOUNTER — Encounter: Payer: Self-pay | Admitting: Obstetrics and Gynecology

## 2018-09-13 VITALS — BP 122/86 | HR 64 | Ht 62.5 in | Wt 133.8 lb

## 2018-09-13 DIAGNOSIS — G35 Multiple sclerosis: Secondary | ICD-10-CM | POA: Diagnosis not present

## 2018-09-13 DIAGNOSIS — Z23 Encounter for immunization: Secondary | ICD-10-CM | POA: Diagnosis not present

## 2018-09-13 DIAGNOSIS — Z01419 Encounter for gynecological examination (general) (routine) without abnormal findings: Secondary | ICD-10-CM | POA: Diagnosis not present

## 2018-09-13 DIAGNOSIS — Z0000 Encounter for general adult medical examination without abnormal findings: Secondary | ICD-10-CM | POA: Diagnosis not present

## 2018-09-13 DIAGNOSIS — Z803 Family history of malignant neoplasm of breast: Secondary | ICD-10-CM | POA: Diagnosis not present

## 2018-09-13 NOTE — Patient Instructions (Signed)

## 2018-09-13 NOTE — Progress Notes (Signed)
53 y.o. G0P0000 Married White or Caucasian Not Hispanic or Latino female here for annual exam.   S/P TLH/BS in 11/18. She is on estroven for vasomotor symptoms which is helping. No vaginal dryness, no dyspareunia.  H/O MS, for a while after surgery she had to self cath, no longer needing to do that. Has constipation for her MS and takes miralax.     Patient's last menstrual period was 05/03/2017.          Sexually active: Yes.    The current method of family planning is status post hysterectomy.    Exercising: Yes.    walking, farm work Smoker:  no  Health Maintenance: Pap:  06/02/17 WNL  History of abnormal Pap:  no MMG: 2018 WNL per patient, unsure where she was seen Colonoscopy:  Never BMD:   Never TDaP: Unsure Gardasil: N/A   reports that she has never smoked. She has never used smokeless tobacco. She reports current alcohol use of about 4.0 standard drinks of alcohol per week. She reports that she does not use drugs.  Past Medical History:  Diagnosis Date  . MS (multiple sclerosis) ([place])   . Neurogenic bladder 04/07/2016  . Neuromuscular disorder ([place])    MS  . Rash    patient reports red, flat rash that is now "cyling out"     Past Surgical History:  Procedure Laterality Date  . ABDOMINAL HYSTERECTOMY    . arm surgery  2014   broken left wrist after fall from side of motunain while horse riding;  still has rods in wrist   . CYSTOSCOPY N/A 06/21/2017   Procedure: CYSTOSCOPY;  Surgeon: Salvadore Dom, MD;  Location: [facility];  Service: Gynecology;  Laterality: N/A;  . KNEE SURGERY  1998   reconstructive ACL   . NOSE SURGERY  2013   broken nose after fall from horse   . TOTAL LAPAROSCOPIC HYSTERECTOMY WITH SALPINGECTOMY Bilateral 06/21/2017   Procedure: TOTAL LAPAROSCOPIC HYSTERECTOMY WITH SALPINGECTOMY;  Surgeon: Salvadore Dom, MD;  Location: [facility];  Service: Gynecology;  Laterality: Bilateral;    Current Outpatient  Medications  Medication Sig Dispense Refill  . baclofen (LIORESAL) 10 MG tablet Take 1 tablet (10 mg total) by mouth 2 (two) times daily as needed for muscle spasms. 180 each 3  . modafinil (PROVIGIL) 100 MG tablet Take 1 tablet (100 mg total) by mouth daily. 90 tablet 1  . REBIF 22 MCG/0.5ML SOSY INJECT 22CMG SUBCUTANEOUSLY THREE TIMES A WEEK 12 Syringe 11   No current facility-administered medications for this visit.     Family History  Problem Relation Age of Onset  . Heart attack Mother   . Breast cancer Sister   . Osteoporosis Sister   . Breast cancer Maternal Grandmother   Sister with breast cancer in her early 74's. Sister also with osteoporosis since her mid 74's.   Review of Systems  Constitutional: Negative.   HENT: Negative.   Eyes: Negative.   Respiratory: Negative.   Cardiovascular: Negative.   Gastrointestinal: Negative.   Endocrine: Negative.   Genitourinary: Negative.   Musculoskeletal: Negative.   Skin: Negative.   Allergic/Immunologic: Negative.   Neurological: Negative.   Hematological: Negative.   Psychiatric/Behavioral: Negative.     Exam:   BP 122/86 (BP Location: Right Arm, Patient Position: Sitting, Cuff Size: Normal)   Pulse 64   Ht 5' 2.5" (1.588 m)   Wt 133 lb 12.8 oz (60.7 kg)   LMP 05/03/2017  BMI 24.08 kg/m   Weight change: @WEIGHTCHANGE @ Height:   Height: 5' 2.5" (158.8 cm)  Ht Readings from Last 3 Encounters:  09/13/18 5' 2.5" (1.588 m)  06/20/18 5\' 3"  (1.6 m)  05/26/18 5\' 3"  (1.6 m)    General appearance: alert, cooperative and appears stated age Head: Normocephalic, without obvious abnormality, atraumatic Neck: no adenopathy, supple, symmetrical, trachea midline and thyroid normal to inspection and palpation Lungs: clear to auscultation bilaterally Cardiovascular: regular rate and rhythm Breasts: normal appearance, no masses or tenderness Abdomen: soft, non-tender; non distended,  no masses,  no organomegaly Extremities:  extremities normal, atraumatic, no cyanosis or edema Skin: Skin color, texture, turgor normal. No rashes or lesions Lymph nodes: Cervical, supraclavicular, and axillary nodes normal. No abnormal inguinal nodes palpated Neurologic: Grossly normal   Pelvic: External genitalia:  no lesions              Urethra:  normal appearing urethra with no masses, tenderness or lesions              Bartholins and Skenes: normal                 Vagina: normal appearing vagina with normal color and discharge, no lesions              Cervix: absent               Bimanual Exam:  Uterus:  uterus absent              Adnexa: no mass, fullness, tenderness               Rectovaginal: Confirms               Anus:  normal sphincter tone, no lesions  Chaperone was present for exam.  A:  Well Woman with normal exam  H/O TLH/BS  H/O MS  P:   No pap needed  Screening labs (not fasting)  3D mammogram  Colonoscopy, she will schedule  TDAP today

## 2018-09-14 LAB — COMPREHENSIVE METABOLIC PANEL
ALT: 24 IU/L (ref 0–32)
AST: 25 IU/L (ref 0–40)
Albumin/Globulin Ratio: 2 (ref 1.2–2.2)
Albumin: 4.5 g/dL (ref 3.8–4.9)
Alkaline Phosphatase: 91 IU/L (ref 39–117)
BUN/Creatinine Ratio: 15 (ref 9–23)
BUN: 10 mg/dL (ref 6–24)
Bilirubin Total: <0.2 mg/dL (ref 0.0–1.2)
CO2: 24 mmol/L (ref 20–29)
Calcium: 9.8 mg/dL (ref 8.7–10.2)
Chloride: 100 mmol/L (ref 96–106)
Creatinine, Ser: 0.68 mg/dL (ref 0.57–1.00)
GFR calc Af Amer: 115 mL/min/{1.73_m2} (ref >59)
GFR calc non Af Amer: 99 mL/min/{1.73_m2} (ref >59)
Globulin, Total: 2.2 g/dL (ref 1.5–4.5)
Glucose: 77 mg/dL (ref 65–99)
Potassium: 3.9 mmol/L (ref 3.5–5.2)
Sodium: 139 mmol/L (ref 134–144)
Total Protein: 6.7 g/dL (ref 6.0–8.5)

## 2018-09-14 LAB — CBC
Hematocrit: 34.8 % (ref 34.0–46.6)
Hemoglobin: 11.7 g/dL (ref 11.1–15.9)
MCH: 31.2 pg (ref 26.6–33.0)
MCHC: 33.6 g/dL (ref 31.5–35.7)
MCV: 93 fL (ref 79–97)
Platelets: 255 10*3/uL (ref 150–450)
RBC: 3.75 x10E6/uL — ABNORMAL LOW (ref 3.77–5.28)
RDW: 11.8 % (ref 11.7–15.4)
WBC: 5.3 10*3/uL (ref 3.4–10.8)

## 2018-09-14 LAB — LIPID PANEL
Chol/HDL Ratio: 2.9 ratio (ref 0.0–4.4)
Cholesterol, Total: 175 mg/dL (ref 100–199)
HDL: 61 mg/dL (ref >39)
LDL Calculated: 93 mg/dL (ref 0–99)
Triglycerides: 105 mg/dL (ref 0–149)
VLDL Cholesterol Cal: 21 mg/dL (ref 5–40)

## 2018-09-27 ENCOUNTER — Other Ambulatory Visit: Payer: Self-pay

## 2018-09-27 ENCOUNTER — Ambulatory Visit (INDEPENDENT_AMBULATORY_CARE_PROVIDER_SITE_OTHER): Payer: Managed Care, Other (non HMO) | Admitting: Family Medicine

## 2018-09-27 ENCOUNTER — Encounter: Payer: Self-pay | Admitting: Family Medicine

## 2018-09-27 VITALS — BP 124/84 | HR 63 | Temp 97.5°F | Ht 62.5 in | Wt 128.2 lb

## 2018-09-27 DIAGNOSIS — R8290 Unspecified abnormal findings in urine: Secondary | ICD-10-CM

## 2018-09-27 LAB — POCT URINALYSIS DIPSTICK
Bilirubin, UA: NEGATIVE
Blood, UA: NEGATIVE
Glucose, UA: NEGATIVE
Ketones, UA: NEGATIVE
Nitrite, UA: NEGATIVE
Protein, UA: NEGATIVE
Spec Grav, UA: 1.01 (ref 1.010–1.025)
Urobilinogen, UA: 0.2 E.U./dL
pH, UA: 7 (ref 5.0–8.0)

## 2018-09-27 MED ORDER — CEPHALEXIN 500 MG PO CAPS: 500.0000 mg | ORAL_CAPSULE | Freq: Three times a day (TID) | ORAL | 0 refills | Status: DC

## 2018-09-27 NOTE — Progress Notes (Signed)
  Subjective:     Patient ID: Rebecca Mejia, female   DOB: May 07, 1966, 53 y.o.   MRN: [id number]  HPI Patient is seen with 3-week history of "cloudy urine".  She has not had any actual burning with urination.  She does have MS and sometimes has difficulty fully emptying her bladder.  She does not do any in and out caths.  Denies any back pain.  No nausea or vomiting.  No fevers or chills.  No gross hematuria.  She had Klebsiella UTI back in the fall which resolved after treatment with Cipro.  Past Medical History:  Diagnosis Date  . MS (multiple sclerosis) ([place])   . Neurogenic bladder 04/07/2016  . Neuromuscular disorder ([place])    MS  . Rash    patient reports red, flat rash that is now "cyling out"    Past Surgical History:  Procedure Laterality Date  . ABDOMINAL HYSTERECTOMY    . arm surgery  2014   broken left wrist after fall from side of motunain while horse riding;  still has rods in wrist   . CYSTOSCOPY N/A 06/21/2017   Procedure: CYSTOSCOPY;  Surgeon: Salvadore Dom, MD;  Location: [facility];  Service: Gynecology;  Laterality: N/A;  . KNEE SURGERY  1998   reconstructive ACL   . NOSE SURGERY  2013   broken nose after fall from horse   . TOTAL LAPAROSCOPIC HYSTERECTOMY WITH SALPINGECTOMY Bilateral 06/21/2017   Procedure: TOTAL LAPAROSCOPIC HYSTERECTOMY WITH SALPINGECTOMY;  Surgeon: Salvadore Dom, MD;  Location: [facility];  Service: Gynecology;  Laterality: Bilateral;    reports that she has never smoked. She has never used smokeless tobacco. She reports current alcohol use of about 4.0 standard drinks of alcohol per week. She reports that she does not use drugs. family history includes Breast cancer in her maternal grandmother and sister; Heart attack in her mother; Osteoporosis in her sister. Allergies  Allergen Reactions  . Codeine Hives and Itching     Review of Systems  Constitutional: Negative for chills and fever.   Gastrointestinal: Negative for abdominal pain.  Genitourinary: Positive for dysuria. Negative for difficulty urinating, flank pain and hematuria.  Musculoskeletal: Negative for back pain.       Objective:   Physical Exam Constitutional:      Appearance: Normal appearance.  Cardiovascular:     Rate and Rhythm: Normal rate and regular rhythm.  Pulmonary:     Effort: Pulmonary effort is normal.     Breath sounds: Normal breath sounds.  Neurological:     Mental Status: She is alert.        Assessment:     Patient presents with several week history of cloudy urine.  She has 3+ leukocytes on dipstick.  Question uncomplicated cystitis    Plan:     -Urine culture sent -Cover with cephalexin 500 mg 3 times daily for 5 days pending culture results -Follow-up immediately for any fever, vomiting, or other concerns  Eulas Post MD [facility] at [facility]

## 2018-09-27 NOTE — Patient Instructions (Signed)

## 2018-09-28 ENCOUNTER — Other Ambulatory Visit: Payer: Self-pay | Admitting: Obstetrics and Gynecology

## 2018-09-28 DIAGNOSIS — Z1231 Encounter for screening mammogram for malignant neoplasm of breast: Secondary | ICD-10-CM

## 2018-09-30 LAB — URINE CULTURE
MICRO NUMBER:: 186037
SPECIMEN QUALITY:: ADEQUATE

## 2018-10-04 ENCOUNTER — Ambulatory Visit
Admission: RE | Admit: 2018-10-04 | Discharge: 2018-10-04 | Disposition: A | Discharge status: Home / Self Care | Payer: Managed Care, Other (non HMO) | Source: Ambulatory Visit | Attending: Obstetrics and Gynecology | Admitting: Obstetrics and Gynecology

## 2018-10-04 DIAGNOSIS — Z1231 Encounter for screening mammogram for malignant neoplasm of breast: Secondary | ICD-10-CM

## 2018-10-06 ENCOUNTER — Other Ambulatory Visit: Payer: Self-pay | Admitting: Obstetrics and Gynecology

## 2018-10-06 DIAGNOSIS — R928 Other abnormal and inconclusive findings on diagnostic imaging of breast: Secondary | ICD-10-CM

## 2018-10-11 ENCOUNTER — Ambulatory Visit
Admission: RE | Admit: 2018-10-11 | Discharge: 2018-10-11 | Disposition: A | Discharge status: Home / Self Care | Payer: Managed Care, Other (non HMO) | Source: Ambulatory Visit | Attending: Obstetrics and Gynecology | Admitting: Obstetrics and Gynecology

## 2018-10-11 DIAGNOSIS — R928 Other abnormal and inconclusive findings on diagnostic imaging of breast: Secondary | ICD-10-CM

## 2018-10-19 ENCOUNTER — Ambulatory Visit: Payer: Self-pay | Admitting: Family Medicine

## 2018-10-19 ENCOUNTER — Telehealth: Payer: Self-pay | Admitting: Neurology

## 2018-10-19 NOTE — Telephone Encounter (Signed)
Pt called in c/o a rash on right thigh and right side that is tingly and stings very slightly.   Enough it woke her up last night.   She has had this rash on and off for 2 months. She has M.S. and does I & O catherization.   She is wondering if the rash is from a possible bladder infection.   Just a guess from the pt.  I scheduled her with Dr. Elease Hashimoto for 10/20/2018 at 8:30 AM.    Reason for Disposition . Localized rash present > 7 days  Answer Assessment - Initial Assessment Questions 1. APPEARANCE of RASH: "Describe the rash."      It's stinging and painful.   It has been occurring for over 2 months.   It comes and goes.    It feels worse than it looks.   It's little red bumps.   My husband saw it.    It lasts for a day and then it's gone for a couple of weeks.  I wonder if getting a bladder infection.   She I & O caths due to M.S. 2. LOCATION: "Where is the rash located?"      Right thigh and right side right now. 3. NUMBER: "How many spots are there?"      Covers 2 inches wide and may 4-5 inches long. 4. SIZE: "How big are the spots?" (Inches, centimeters or compare to size of a coin)      Like pin sized red dots. 5. ONSET: "When did the rash start?"      It happened last night.   It woke me up about midnight. 6. ITCHING: "Does the rash itch?" If so, ask: "How bad is the itch?"  (Scale 1-10; or mild, moderate, severe)     Slightly itches.   It's more tingly. 7. PAIN: "Does the rash hurt?" If so, ask: "How bad is the pain?"  (Scale 1-10; or mild, moderate, severe)     Mild.   Tingling and mild stinging.   It comes and goes. 8. OTHER SYMPTOMS: "Do you have any other symptoms?" (e.g., fever)     She has M.S. 9. PREGNANCY: "Is there any chance you are pregnant?" "When was your last menstrual period?"     No  hyterectomy  Protocols used: RASH OR REDNESS - LOCALIZED-A-AH

## 2018-10-20 ENCOUNTER — Other Ambulatory Visit: Payer: Self-pay

## 2018-10-20 ENCOUNTER — Ambulatory Visit: Payer: Self-pay | Admitting: Family Medicine

## 2018-10-20 ENCOUNTER — Encounter: Payer: Self-pay | Admitting: Family Medicine

## 2018-10-20 VITALS — BP 116/78 | HR 68 | Temp 97.7°F | Ht 62.5 in | Wt 130.6 lb

## 2018-10-20 DIAGNOSIS — N319 Neuromuscular dysfunction of bladder, unspecified: Secondary | ICD-10-CM

## 2018-10-20 DIAGNOSIS — R21 Rash and other nonspecific skin eruption: Secondary | ICD-10-CM

## 2018-10-20 MED ORDER — TRIAMCINOLONE ACETONIDE 0.1 % EX CREA: 1.0000 "application " | TOPICAL_CREAM | Freq: Two times a day (BID) | CUTANEOUS | 2 refills | Status: DC

## 2018-10-20 NOTE — Progress Notes (Addendum)
Subjective:     Patient ID: Rebecca Mejia, female   DOB: Apr 05, 1966, 53 y.o.   MRN: [id number]  HPI Patient presents with intermittent rash on her lower abdominal region for past couple months.  She states that the rash is very transient usually only lasts a day or 2 and then fades away.  Mild itching.  More than that though she has some mild burning sensation.  This is bilateral.  She denies any history of shingles.  She has not had any vesicular rash to her knowledge.  She has noted only correlation seems to be it flares after eating a big meal or when she is straining to urinate.  She has MS and only regular medication is Rebif.  She has had difficulty with emptying her bladder and in fact yesterday after urinating her husband catheterize her and they got out 453 mL's.  She did have recent UTI but those symptoms resolved after treatment.  She had full urologic work-up with what sounds like urodynamics about a year ago at that point urologist had suggested in and out caths but she was not interested in pursuing that.  Past Medical History:  Diagnosis Date  . MS (multiple sclerosis) ([place])   . Neurogenic bladder 04/07/2016  . Neuromuscular disorder ([place])    MS  . Rash    patient reports red, flat rash that is now "cyling out"    Past Surgical History:  Procedure Laterality Date  . ABDOMINAL HYSTERECTOMY    . arm surgery  2014   broken left wrist after fall from side of motunain while horse riding;  still has rods in wrist   . CYSTOSCOPY N/A 06/21/2017   Procedure: CYSTOSCOPY;  Surgeon: Salvadore Dom, MD;  Location: [facility];  Service: Gynecology;  Laterality: N/A;  . KNEE SURGERY  1998   reconstructive ACL   . NOSE SURGERY  2013   broken nose after fall from horse   . TOTAL LAPAROSCOPIC HYSTERECTOMY WITH SALPINGECTOMY Bilateral 06/21/2017   Procedure: TOTAL LAPAROSCOPIC HYSTERECTOMY WITH SALPINGECTOMY;  Surgeon: Salvadore Dom, MD;  Location: [facility];  Service: Gynecology;  Laterality: Bilateral;    reports that she has never smoked. She has never used smokeless tobacco. She reports current alcohol use of about 4.0 standard drinks of alcohol per week. She reports that she does not use drugs. family history includes Breast cancer in her maternal grandmother and sister; Heart attack in her mother; Osteoporosis in her sister. Allergies  Allergen Reactions  . Codeine Hives and Itching     Review of Systems  Constitutional: Negative for chills and fever.  Genitourinary: Positive for difficulty urinating. Negative for flank pain and hematuria.  Skin: Positive for rash.       Objective:   Physical Exam Vitals signs reviewed.  Constitutional:      Appearance: Normal appearance.  Cardiovascular:     Rate and Rhythm: Normal rate and regular rhythm.  Pulmonary:     Effort: Pulmonary effort is normal.     Breath sounds: Normal breath sounds.  Skin:    Comments: He has macular erythematous rash which is blanching with pressure with some scattered areas that are about 1 to 2 mm diameter.  She has a similar patch on her back and on the mid to lower thoracic region.  No vesicles.  Nontender.  Neurological:     Mental Status: She is alert.        Assessment:     #  1 nonspecific skin rash.  This is somewhat bilateral and even though she has some burning sensation does not clinically appear consistent with shingles.  However, it does seem to follow a little bit of a dermatome type distribution especially on the right side.  #2 history of MS with neurogenic bladder with progressive difficulty emptying    Plan:     -Triamcinolone 0.1% cream twice daily  -We recommend she consider follow-up with urology regarding her neurogenic bladder issues.  She declines at this time.  Eulas Post MD [facility] at [facility]  10-23-18  Note:  Spoke with pt.  Her rash is already improving.  We discussed option  regarding bladder issues and inability to empty.  She declines urology referral at this time.  ? Detrusor sphincter dyssynergia.   Discussed possible trial of Flomax 0.4 mg qhs and she would like to try.   Will send in and she can give [place] some feedback in few weeks.  Eulas Post MD [facility] at [facility]

## 2018-10-23 MED ORDER — TAMSULOSIN HCL 0.4 MG PO CAPS: 0.4000 mg | ORAL_CAPSULE | Freq: Every day | ORAL | 5 refills | Status: DC

## 2018-10-23 NOTE — Addendum Note (Signed)
Addended by: Eulas Post on: 10/23/2018 09:00 AM   Modules accepted: Orders

## 2018-11-06 NOTE — Telephone Encounter (Signed)
error 

## 2019-01-16 ENCOUNTER — Other Ambulatory Visit: Payer: Self-pay

## 2019-01-16 ENCOUNTER — Ambulatory Visit (INDEPENDENT_AMBULATORY_CARE_PROVIDER_SITE_OTHER): Payer: Managed Care, Other (non HMO) | Admitting: Family Medicine

## 2019-01-16 DIAGNOSIS — R300 Dysuria: Secondary | ICD-10-CM | POA: Diagnosis not present

## 2019-01-16 DIAGNOSIS — N319 Neuromuscular dysfunction of bladder, unspecified: Secondary | ICD-10-CM

## 2019-01-16 MED ORDER — CEPHALEXIN 500 MG PO CAPS: 500.0000 mg | ORAL_CAPSULE | Freq: Four times a day (QID) | ORAL | 0 refills | Status: DC

## 2019-01-16 NOTE — Progress Notes (Signed)
Patient ID: Rebecca Mejia, female   DOB: Sep 07, 1965, 53 y.o.   MRN: [id number]  This visit type was conducted due to national recommendations for restrictions regarding the COVID-19 pandemic in an effort to limit this patient's exposure and mitigate transmission in our community.   Virtual Visit via Video Note  I connected with Rebecca Mejia on 01/16/19 at  9:00 AM EDT by a video enabled telemedicine application and verified that I am speaking with the correct person using two identifiers.  Location patient: home Location provider:work or home office Persons participating in the virtual visit: patient, provider  I discussed the limitations of evaluation and management by telemedicine and the availability of in person appointments. The patient expressed understanding and agreed to proceed.   HPI:  Patient has history of MS.  She is concerned she may have recurrent UTI.  She had Klebsiella UTI both last fall and last February.  This was resistant to ampicillin and Macrobid.  She was treated with Keflex in February and did well.  She recently got back from a long 11-day camping trip.  They camp with her horses and she did 11 days of horseback riding.  The trail riding was for long periods and she was required to hold her urine longer than usual.  She also did not drink as much fluid.  She thinks these factors combined to put her at higher risk.  Approximately 1 week of cloudy urine along with some frequency.  Minimal burning but she usually does not notice burning very often.  She does have history of MS and neurogenic bladder and previous urologic work-up revealed residual urine over 400 cc.  She declined in and out caths.  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  She is not aware of any recent fevers or chills.  No nausea or vomiting.  No flank pain.   ROS: See pertinent  positives and negatives per HPI.  Past Medical History:  Diagnosis Date  . MS (multiple sclerosis) ([place])   . Neurogenic bladder 04/07/2016  . Neuromuscular disorder ([place])    MS  . Rash    patient reports red, flat rash that is now "cyling out"     Past Surgical History:  Procedure Laterality Date  . ABDOMINAL HYSTERECTOMY    . arm surgery  2014   broken left wrist after fall from side of motunain while horse riding;  still has rods in wrist   . CYSTOSCOPY N/A 06/21/2017   Procedure: CYSTOSCOPY;  Surgeon: Salvadore Dom, MD;  Location: [facility];  Service: Gynecology;  Laterality: N/A;  . KNEE SURGERY  1998   reconstructive ACL   . NOSE SURGERY  2013   broken nose after fall from horse   . TOTAL LAPAROSCOPIC HYSTERECTOMY WITH SALPINGECTOMY Bilateral 06/21/2017   Procedure: TOTAL LAPAROSCOPIC HYSTERECTOMY WITH SALPINGECTOMY;  Surgeon: Salvadore Dom, MD;  Location: [facility];  Service: Gynecology;  Laterality: Bilateral;    Family History  Problem Relation Age of Onset  . Heart attack Mother   . Breast cancer Sister   . Osteoporosis Sister   . Breast cancer Maternal Grandmother     SOCIAL HX: Non-smoker   Current Outpatient Medications:  .  baclofen (LIORESAL) 10 MG tablet, Take 1 tablet (10 mg total) by mouth 2 (two) times daily as needed for muscle spasms., Disp: 180 each, Rfl: 3 .  cephALEXin (KEFLEX) 500 MG capsule, Take 1 capsule (500 mg total) by mouth 4 (four) times daily., Disp: 20 capsule, Rfl: 0 .  modafinil (PROVIGIL) 100 MG tablet, Take 1 tablet (100 mg total) by mouth daily., Disp: 90 tablet, Rfl: 1 .  REBIF 22 MCG/0.5ML SOSY, INJECT 22CMG SUBCUTANEOUSLY THREE TIMES A WEEK, Disp: 12 Syringe, Rfl: 11 .  tamsulosin (FLOMAX) 0.4 MG CAPS capsule, Take 1 capsule (0.4 mg total) by mouth daily., Disp: 30 capsule, Rfl: 5 .  triamcinolone cream (KENALOG) 0.1 %, Apply 1 application topically 2 (two) times daily., Disp: 30 g, Rfl:  2  EXAM:  VITALS per patient if applicable:  GENERAL: alert, oriented, appears well and in no acute distress  HEENT: atraumatic, conjunttiva clear, no obvious abnormalities on inspection of external nose and ears  NECK: normal movements of the head and neck  LUNGS: on inspection no signs of respiratory distress, breathing rate appears normal, no obvious gross SOB, gasping or wheezing  CV: no obvious cyanosis  MS: moves all visible extremities without noticeable abnormality  PSYCH/NEURO: pleasant and cooperative, no obvious depression or anxiety, speech and thought processing grossly intact  ASSESSMENT AND PLAN:  Discussed the following assessment and plan:  Recurrent dysuria.  Increased risk for bladder infection with her neurogenic bladder from MS.  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 The patient was provided an opportunity to ask questions and all were answered. The patient agreed with the plan and demonstrated an understanding of the instructions.   The patient was advised to call back or seek an in-person evaluation if the symptoms worsen or if the condition fails to improve as anticipated.   Carolann Littler, MD

## 2019-01-29 ENCOUNTER — Other Ambulatory Visit: Payer: Self-pay | Admitting: Neurology

## 2019-01-30 ENCOUNTER — Ambulatory Visit: Payer: Managed Care, Other (non HMO) | Admitting: Neurology

## 2019-01-30 ENCOUNTER — Telehealth: Payer: Self-pay

## 2019-01-30 NOTE — Telephone Encounter (Signed)
I contacted the pt and completed the pre charting for 01/31/19 virtual visit.    Pt understands that although there may be some limitations with this type of visit, we will take all precautions to reduce any security or privacy concerns.  Pt understands that this will be treated like an in office visit and we will file with pt's insurance, and there may be a patient responsible charge related to this service.

## 2019-01-31 ENCOUNTER — Encounter: Payer: Self-pay | Admitting: Neurology

## 2019-01-31 ENCOUNTER — Telehealth (INDEPENDENT_AMBULATORY_CARE_PROVIDER_SITE_OTHER): Payer: Managed Care, Other (non HMO) | Admitting: Neurology

## 2019-01-31 DIAGNOSIS — G35 Multiple sclerosis: Secondary | ICD-10-CM

## 2019-01-31 DIAGNOSIS — N319 Neuromuscular dysfunction of bladder, unspecified: Secondary | ICD-10-CM | POA: Diagnosis not present

## 2019-01-31 MED ORDER — MODAFINIL 100 MG PO TABS: 100.0000 mg | ORAL_TABLET | Freq: Every day | ORAL | 1 refills | Status: DC

## 2019-01-31 NOTE — Progress Notes (Signed)
     Virtual Visit via Video Note  I connected with Rebecca Mejia on 01/31/19 at 10:00 AM EDT by a video enabled telemedicine application and verified that I am speaking with the correct person using two identifiers.  Location: Patient: The patient is at home. Provider: Physician is in office.   I discussed the limitations of evaluation and management by telemedicine and the availability of in person appointments. The patient expressed understanding and agreed to proceed.  History of Present Illness: Rebecca Mejia is a 53 year old right-handed white female with a history of multiple sclerosis.  The patient has a neurogenic bladder associated with this.  She recently was placed on Flomax which has been of some benefit to void the bladder.  The patient did have a recent urinary tract infection.  The patient has not had any new numbness or weakness of the face, arms, or legs.  She is on Rebif, she tolerates the medication well.  She denies any significant balance issues.  She denies any vision changes.  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   Observations/Objective: The video evaluation reveals that the patient is alert and cooperative.  Speech is well enunciated, not aphasic or dysarthric.  Extraocular movements are full.  The patient has a symmetric face, she is able to protrude the tongue in the midline with good lateral movement of the tongue.  The patient has good finger-nose-finger and heel-to-shin bilaterally.  Gait is normal.  Tandem gait is slightly unsteady.  Romberg is negative, no drift is seen.  The patient is moderately obese.  Assessment and Plan: 1.  Multiple sclerosis  2.  Neurogenic bladder  The patient is doing well on the Rebif, she has had blood work done to include a CBC and a comprehensive metabolic profile in January 2020.  The patient will continue on this medication for now.  A prescription will be sent in for the Provigil which she  takes as needed but finds that it offers good benefit with treating her fatigue.  She remains on baclofen.  She will follow-up here in 1 year, sooner if needed.  We may consider a repeat MRI of the brain on the next visit.  Follow Up Instructions: 1 year follow-up, may see nurse practitioner.   I discussed the assessment and treatment plan with the patient. The patient was provided an opportunity to ask questions and all were answered. The patient agreed with the plan and demonstrated an understanding of the instructions.   The patient was advised to call back or seek an in-person evaluation if the symptoms worsen or if the condition fails to improve as anticipated.  I provided 15 minutes of non-face-to-face time during this encounter.   Kathrynn Ducking, MD

## 2019-03-23 ENCOUNTER — Telehealth (INDEPENDENT_AMBULATORY_CARE_PROVIDER_SITE_OTHER): Payer: Managed Care, Other (non HMO) | Admitting: Family Medicine

## 2019-03-23 ENCOUNTER — Other Ambulatory Visit: Payer: Self-pay

## 2019-03-23 DIAGNOSIS — L259 Unspecified contact dermatitis, unspecified cause: Secondary | ICD-10-CM

## 2019-03-23 MED ORDER — PREDNISONE 10 MG PO TABS: ORAL_TABLET | ORAL | 0 refills | Status: DC

## 2019-03-23 MED ORDER — TAMSULOSIN HCL 0.4 MG PO CAPS: 0.4000 mg | ORAL_CAPSULE | Freq: Every day | ORAL | 3 refills | Status: DC

## 2019-03-23 NOTE — Progress Notes (Signed)
This visit type was conducted due to national recommendations for restrictions regarding the COVID-19 pandemic in an effort to limit this patient's exposure and mitigate transmission in our community.   Virtual Visit via Video Note  I connected with Betsi Crespi") Melucci on 03/23/19 at  3:30 PM EDT by a video enabled telemedicine application and verified that I am speaking with the correct person using two identifiers.  Location patient: home Location provider:work or home office Persons participating in the virtual visit: patient, provider  I discussed the limitations of evaluation and management by telemedicine and the availability of in person appointments. The patient expressed understanding and agreed to proceed.   HPI: Jacqlyn Larsen is seen with rash lower extremities of 1 day duration.  Slightly raised and pruritic and on both lower extremities predominantly thighs but also legs.  She and her husband were getting up a tree recently that had fallen down and she thinks she may have gotten in touch with something then.  Moderate pruritus.  No relief with over-the-counter topical cortisone cream.  No upper extremity involvement.  No fever.  No pustules.  No associated pain.  We had recently tried Flomax for her neurogenic bladder type symptoms and they have improved.  She would like renewal.  No side effects.   ROS: See pertinent positives and negatives per HPI.  Past Medical History:  Diagnosis Date  . MS (multiple sclerosis) ([place])   . Neurogenic bladder 04/07/2016  . Neuromuscular disorder ([place])    MS  . Rash    patient reports red, flat rash that is now "cyling out"     Past Surgical History:  Procedure Laterality Date  . ABDOMINAL HYSTERECTOMY    . arm surgery  2014   broken left wrist after fall from side of motunain while horse riding;  still has rods in wrist   . CYSTOSCOPY N/A 06/21/2017   Procedure: CYSTOSCOPY;  Surgeon: Salvadore Dom, MD;  Location: [facility];  Service: Gynecology;  Laterality: N/A;  . KNEE SURGERY  1998   reconstructive ACL   . NOSE SURGERY  2013   broken nose after fall from horse   . TOTAL LAPAROSCOPIC HYSTERECTOMY WITH SALPINGECTOMY Bilateral 06/21/2017   Procedure: TOTAL LAPAROSCOPIC HYSTERECTOMY WITH SALPINGECTOMY;  Surgeon: Salvadore Dom, MD;  Location: [facility];  Service: Gynecology;  Laterality: Bilateral;    Family History  Problem Relation Age of Onset  . Heart attack Mother   . Breast cancer Sister   . Osteoporosis Sister   . Breast cancer Maternal Grandmother     SOCIAL HX: Non-smoker   Current Outpatient Medications:  .  baclofen (LIORESAL) 10 MG tablet, TAKE ONE TABLET BY MOUTH TWO TIMES A DAY AS NEEDED FOR MUSCLE SPASMS., Disp: 180 tablet, Rfl: 2 .  modafinil (PROVIGIL) 100 MG tablet, Take 1 tablet (100 mg total) by mouth daily., Disp: 90 tablet, Rfl: 1 .  predniSONE (DELTASONE) 10 MG tablet, Taper as follows: 4-4-4-4-3-3-3-2-2-2-1-1, Disp: 33 tablet, Rfl: 0 .  REBIF 22 MCG/0.5ML SOSY, INJECT 22CMG SUBCUTANEOUSLY THREE TIMES A WEEK, Disp: 12 Syringe, Rfl: 11 .  tamsulosin (FLOMAX) 0.4 MG CAPS capsule, Take 1 capsule (0.4 mg total) by mouth daily., Disp: 90 capsule, Rfl: 3 .  triamcinolone cream (KENALOG) 0.1 %, Apply 1 application topically 2 (two) times daily., Disp: 30 g, Rfl: 2  EXAM:  VITALS per patient if applicable:  GENERAL: alert, oriented, appears well and in no acute distress  HEENT: atraumatic, conjunttiva clear,  no obvious abnormalities on inspection of external nose and ears  NECK: normal movements of the head and neck  LUNGS: on inspection no signs of respiratory distress, breathing rate appears normal, no obvious gross SOB, gasping or wheezing  CV: no obvious cyanosis  MS: moves all visible extremities without noticeable abnormality  PSYCH/NEURO: pleasant and cooperative, no obvious depression or anxiety, speech and thought processing grossly  intact  Skin-from appearance on video these are slightly raised and appear to be slightly vesicular with several scattered lesions on her lower extremities.  No pustules.  ASSESSMENT AND PLAN:  Discussed the following assessment and plan:  Contact dermatitis, unspecified contact dermatitis type, unspecified trigger  -Prednisone taper starting at 40 mg daily -Touch base for any persistent or worsening rash     I discussed the assessment and treatment plan with the patient. The patient was provided an opportunity to ask questions and all were answered. The patient agreed with the plan and demonstrated an understanding of the instructions.   The patient was advised to call back or seek an in-person evaluation if the symptoms worsen or if the condition fails to improve as anticipated.    Carolann Littler, MD

## 2019-04-17 ENCOUNTER — Other Ambulatory Visit: Payer: Self-pay

## 2019-04-17 MED ORDER — REBIF 22 MCG/0.5ML ~~LOC~~ SOSY: 22.0000 ug | PREFILLED_SYRINGE | SUBCUTANEOUS | 4 refills | Status: DC

## 2019-04-30 ENCOUNTER — Telehealth: Payer: Self-pay

## 2019-04-30 NOTE — Telephone Encounter (Signed)
PA for Rebif has been apporved Request ID [id number]. Authorization is effective 04/27/2019-04/26/2020.

## 2019-09-10 ENCOUNTER — Other Ambulatory Visit: Payer: Self-pay | Admitting: *Deleted

## 2019-09-10 MED ORDER — REBIF 22 MCG/0.5ML ~~LOC~~ SOSY: 22.0000 ug | PREFILLED_SYRINGE | SUBCUTANEOUS | 4 refills | Status: DC

## 2019-09-17 ENCOUNTER — Ambulatory Visit: Payer: Managed Care, Other (non HMO) | Admitting: Obstetrics and Gynecology

## 2019-10-02 ENCOUNTER — Other Ambulatory Visit: Payer: Self-pay

## 2019-10-02 ENCOUNTER — Telehealth (INDEPENDENT_AMBULATORY_CARE_PROVIDER_SITE_OTHER): Payer: Managed Care, Other (non HMO) | Admitting: Family Medicine

## 2019-10-02 DIAGNOSIS — R300 Dysuria: Secondary | ICD-10-CM | POA: Diagnosis not present

## 2019-10-02 MED ORDER — CEPHALEXIN 500 MG PO CAPS: 500.0000 mg | ORAL_CAPSULE | Freq: Four times a day (QID) | ORAL | 0 refills | Status: DC

## 2019-10-02 NOTE — Progress Notes (Signed)
Dysuria this visit type was conducted due to national recommendations for restrictions regarding the COVID-19 pandemic in an effort to limit this patient's exposure and mitigate transmission in our community.   Virtual Visit via Video Note  I connected with Rebecca Mejia on 10/02/19 at  4:15 PM EST by a video enabled telemedicine application and verified that I am speaking with the correct person using two identifiers.  Location patient: home Location provider:work or home office Persons participating in the virtual visit: patient, provider  I discussed the limitations of evaluation and management by telemedicine and the availability of in person appointments. The patient expressed understanding and agreed to proceed.   HPI:  Rebecca Mejia had called with several day history of cloudy urine, burning with urination, and some urine frequency.  She had Klebsiella UTI a year ago this time.  She has MS and neurogenic bladder which places her at high risk.  She denies any fevers or chills.  No flank pain.  No nausea or vomiting.  She has no known antibiotic allergies.  ROS: See pertinent positives and negatives per HPI.  Past Medical History:  Diagnosis Date  . MS (multiple sclerosis) ([place])   . Neurogenic bladder 04/07/2016  . Neuromuscular disorder ([place])    MS  . Rash    patient reports red, flat rash that is now "cyling out"     Past Surgical History:  Procedure Laterality Date  . ABDOMINAL HYSTERECTOMY    . arm surgery  2014   broken left wrist after fall from side of [place]unain while horse riding;  still has rods in wrist   . CYSTOSCOPY N/A 06/21/2017   Procedure: CYSTOSCOPY;  Surgeon: Salvadore Dom, MD;  Location: [facility];  Service: Gynecology;  Laterality: N/A;  . KNEE SURGERY  1998   reconstructive ACL   . NOSE SURGERY  2013   broken nose after fall from horse   . TOTAL LAPAROSCOPIC HYSTERECTOMY WITH SALPINGECTOMY Bilateral 06/21/2017   Procedure: TOTAL  LAPAROSCOPIC HYSTERECTOMY WITH SALPINGECTOMY;  Surgeon: Salvadore Dom, MD;  Location: [facility];  Service: Gynecology;  Laterality: Bilateral;    Family History  Problem Relation Age of Onset  . Heart attack Mother   . Breast cancer Sister   . Osteoporosis Sister   . Breast cancer Maternal Grandmother     SOCIAL HX: Non-smoker   Current Outpatient Medications:  .  baclofen (LIORESAL) 10 MG tablet, TAKE ONE TABLET BY MOUTH TWO TIMES A DAY AS NEEDED FOR MUSCLE SPASMS., Disp: 180 tablet, Rfl: 2 .  cephALEXin (KEFLEX) 500 MG capsule, Take 1 capsule (500 mg total) by mouth 4 (four) times daily., Disp: 20 capsule, Rfl: 0 .  Interferon Beta-1a (REBIF) 22 MCG/0.5ML SOSY, Inject 22 mcg into the skin 3 (three) times a week., Disp: 12 mL, Rfl: 4 .  modafinil (PROVIGIL) 100 MG tablet, Take 1 tablet (100 mg total) by mouth daily., Disp: 90 tablet, Rfl: 1 .  predniSONE (DELTASONE) 10 MG tablet, Taper as follows: 4-4-4-4-3-3-3-2-2-2-1-1, Disp: 33 tablet, Rfl: 0 .  tamsulosin (FLOMAX) 0.4 MG CAPS capsule, Take 1 capsule (0.4 mg total) by mouth daily., Disp: 90 capsule, Rfl: 3 .  triamcinolone cream (KENALOG) 0.1 %, Apply 1 application topically 2 (two) times daily., Disp: 30 g, Rfl: 2  EXAM:  VITALS per patient if applicable:  GENERAL: alert, oriented, appears well and in no acute distress  HEENT: atraumatic, conjunttiva clear, no obvious abnormalities on inspection of external nose and ears  NECK: normal movements of the head and neck  LUNGS: on inspection no signs of respiratory distress, breathing rate appears normal, no obvious gross SOB, gasping or wheezing  CV: no obvious cyanosis  MS: moves all visible extremities without noticeable abnormality  PSYCH/NEURO: pleasant and cooperative, no obvious depression or anxiety, speech and thought processing grossly intact  ASSESSMENT AND PLAN:  Discussed the following assessment and plan:  Probable UTI based on  symptoms.  -We agreed to go and cover empirically with cephalexin 500 g 4 times daily for 5 days.  Follow-up promptly for any persistent or worsening symptoms.     I discussed the assessment and treatment plan with the patient. The patient was provided an opportunity to ask questions and all were answered. The patient agreed with the plan and demonstrated an understanding of the instructions.   The patient was advised to call back or seek an in-person evaluation if the symptoms worsen or if the condition fails to improve as anticipated.     Carolann Littler, MD

## 2019-10-30 ENCOUNTER — Other Ambulatory Visit: Payer: Self-pay | Admitting: Neurology

## 2019-10-30 MED ORDER — MODAFINIL 100 MG PO TABS: 100.0000 mg | ORAL_TABLET | Freq: Every day | ORAL | 1 refills | Status: DC

## 2019-10-30 NOTE — Addendum Note (Signed)
Addended by: Kathrynn Ducking on: 10/30/2019 06:38 PM   Modules accepted: Orders

## 2019-10-30 NOTE — Telephone Encounter (Signed)
1) Medication(s) Requested (by name): modafinil (PROVIGIL) 100 MG tablet   2) Pharmacy of Choice: [facility] [address] [place], [place] [address]

## 2019-11-05 NOTE — Telephone Encounter (Signed)
Voicemail left at 12:01p:  Pt called to request refill be sent to her pharmacy.

## 2019-12-05 ENCOUNTER — Telehealth: Payer: Self-pay | Admitting: Neurology

## 2019-12-05 NOTE — Telephone Encounter (Signed)
Pt husband called to inform his employer is requesting a letter for optional FMLA for him informing he is pts caretaker. States he needs rest for his job since he works third shift.

## 2019-12-06 NOTE — Telephone Encounter (Signed)
I called pts husband about needing a work letter to help with pts appts or care. He stated his job only needs a letter stating pt is seen by Dr.WIllis, what her diagnosis is and he is the sole caretaker of patient. The husband could not find fax number but verified mailing address for it be mailed.He works third shift and will call back if he finds the correct fax number. I will do letter and mailed on Monday when Dr.WIllis is in office.

## 2019-12-10 NOTE — Telephone Encounter (Signed)
Letter put in mail for husband for his job.

## 2020-01-07 DIAGNOSIS — Z0289 Encounter for other administrative examinations: Secondary | ICD-10-CM

## 2020-01-15 ENCOUNTER — Telehealth: Payer: Self-pay

## 2020-01-15 NOTE — Telephone Encounter (Signed)
I called pts husband to obtain information about how many hours he needs a week in case he needs to be out of work to care for his wife on the FMLA form. He reported working third shift. He stated sometimes pt has no energy and cannot do house hold duties if she has a multiple sclerosis flare up. He will need 1-2 days a week at 8 hours a day. Form completed and fee paid.Form given to Hilda Blades in medical records.

## 2020-01-17 ENCOUNTER — Telehealth: Payer: Self-pay | Admitting: *Deleted

## 2020-01-17 NOTE — Telephone Encounter (Signed)
Pt fmla form @ front desk for p/u

## 2020-02-11 ENCOUNTER — Other Ambulatory Visit: Payer: Self-pay

## 2020-02-11 MED ORDER — REBIF 22 MCG/0.5ML ~~LOC~~ SOSY: 22.0000 ug | PREFILLED_SYRINGE | SUBCUTANEOUS | 4 refills | Status: DC

## 2020-02-12 ENCOUNTER — Ambulatory Visit: Payer: Managed Care, Other (non HMO) | Admitting: Neurology

## 2020-02-12 ENCOUNTER — Encounter: Payer: Self-pay | Admitting: Neurology

## 2020-02-12 ENCOUNTER — Telehealth: Payer: Self-pay | Admitting: Neurology

## 2020-02-12 ENCOUNTER — Other Ambulatory Visit: Payer: Self-pay

## 2020-02-12 VITALS — BP 113/68 | HR 71 | Ht 63.0 in | Wt 132.0 lb

## 2020-02-12 DIAGNOSIS — N319 Neuromuscular dysfunction of bladder, unspecified: Secondary | ICD-10-CM

## 2020-02-12 DIAGNOSIS — G35 Multiple sclerosis: Secondary | ICD-10-CM

## 2020-02-12 MED ORDER — BACLOFEN 10 MG PO TABS: ORAL_TABLET | ORAL | 2 refills | Status: DC

## 2020-02-12 NOTE — Patient Instructions (Signed)
Continue current medications Check blood work  Check MRI of the brain  See you back in 1 year

## 2020-02-12 NOTE — Progress Notes (Signed)
PATIENT: Rebecca Mejia DOB: 1966/04/30  REASON FOR VISIT: follow up HISTORY FROM: patient  HISTORY OF PRESENT ILLNESS: Today 02/12/20  Ms. Tourville is a 54 year old female with history of multiple sclerosis.  She has neurogenic bladder.  She is on Rebif, Flomax, Provigil, and baclofen.  Her condition remains overall stable.  She does have issues with balance, bladder, muscle spasms, fatigue, and cognitive effects secondary to MS.  The Flomax has been quite helpful, used to have frequent urinary accidents, also UTIs.  She will occasionally do intermittent catheterizations. Takes baclofen PRN for muscle spasm.  She has several horses at her home, has a horse hauling business, she is quite active.  Denies any overall changes in her MS symptoms.  Presents today for evaluation unaccompanied.  HISTORY 01/31/2019 Dr. Jannifer Franklin: Rebecca Mejia is a 54 year old right-handed white female with a history of multiple sclerosis.  The patient has a neurogenic bladder associated with this.  She recently was placed on Flomax which has been of some benefit to void the bladder.  The patient did have a recent urinary tract infection.  The patient has not had any new numbness or weakness of the face, arms, or legs.  She is on Rebif, she tolerates the medication well.  She denies any significant balance issues.  She denies any vision changes.  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  REVIEW OF SYSTEMS: Out of a complete 14 system review of symptoms, the patient complains only of the following symptoms, and all other reviewed systems are negative.  Fatigue  ALLERGIES: Allergies  Allergen Reactions  . Codeine Hives and Itching    HOME MEDICATIONS: Outpatient Medications Prior to Visit  Medication Sig Dispense Refill  . Interferon Beta-1a (REBIF) 22 MCG/0.5ML SOSY Inject 22 mcg into the skin 3 (three) times a week. 12 mL 4  . modafinil (PROVIGIL) 100 MG tablet Take 1 tablet (100 mg  total) by mouth daily. 90 tablet 1  . tamsulosin (FLOMAX) 0.4 MG CAPS capsule Take 1 capsule (0.4 mg total) by mouth daily. 90 capsule 3  . baclofen (LIORESAL) 10 MG tablet TAKE ONE TABLET BY MOUTH TWO TIMES A DAY AS NEEDED FOR MUSCLE SPASMS. 180 tablet 2  . cephALEXin (KEFLEX) 500 MG capsule Take 1 capsule (500 mg total) by mouth 4 (four) times daily. 20 capsule 0  . predniSONE (DELTASONE) 10 MG tablet Taper as follows: 4-4-4-4-3-3-3-2-2-2-1-1 33 tablet 0  . triamcinolone cream (KENALOG) 0.1 % Apply 1 application topically 2 (two) times daily. 30 g 2   No facility-administered medications prior to visit.    PAST MEDICAL HISTORY: Past Medical History:  Diagnosis Date  . MS (multiple sclerosis) ([place])   . Neurogenic bladder 04/07/2016  . Neuromuscular disorder ([place])    MS  . Rash    patient reports red, flat rash that is now "cyling out"     PAST SURGICAL HISTORY: Past Surgical History:  Procedure Laterality Date  . ABDOMINAL HYSTERECTOMY    . arm surgery  2014   broken left wrist after fall from side of [place]unain while horse riding;  still has rods in wrist   . CYSTOSCOPY N/A 06/21/2017   Procedure: CYSTOSCOPY;  Surgeon: Salvadore Dom, MD;  Location: [facility];  Service: Gynecology;  Laterality: N/A;  . KNEE SURGERY  1998   reconstructive ACL   . NOSE SURGERY  2013   broken nose after fall from horse   . TOTAL LAPAROSCOPIC  HYSTERECTOMY WITH SALPINGECTOMY Bilateral 06/21/2017   Procedure: TOTAL LAPAROSCOPIC HYSTERECTOMY WITH SALPINGECTOMY;  Surgeon: Salvadore Dom, MD;  Location: [facility];  Service: Gynecology;  Laterality: Bilateral;    FAMILY HISTORY: Family History  Problem Relation Age of Onset  . Heart attack Mother   . Breast cancer Sister   . Osteoporosis Sister   . Breast cancer Maternal Grandmother     SOCIAL HISTORY: Social History   Socioeconomic History  . Marital status: Married    Spouse name: Not on file  .  Number of children: Not on file  . Years of education: Not on file  . Highest education level: Not on file  Occupational History  . Not on file  Tobacco Use  . Smoking status: Never Smoker  . Smokeless tobacco: Never Used  Vaping Use  . Vaping Use: Never used  Substance and Sexual Activity  . Alcohol use: Yes    Alcohol/week: 4.0 standard drinks    Types: 4 Glasses of wine per week  . Drug use: No  . Sexual activity: Yes    Partners: Male    Birth control/protection: None    Comment: husband vasectomy, hysterectomy  Other Topics Concern  . Not on file  Social History Narrative  . Not on file   Social Determinants of Health   Financial Resource Strain:   . Difficulty of Paying Living Expenses:   Food Insecurity:   . Worried About Charity fundraiser in the Last Year:   . Arboriculturist in the Last Year:   Transportation Needs:   . Film/video editor (Medical):   Marland Kitchen Lack of Transportation (Non-Medical):   Physical Activity:   . Days of Exercise per Week:   . Minutes of Exercise per Session:   Stress:   . Feeling of Stress :   Social Connections:   . Frequency of Communication with Friends and Family:   . Frequency of Social Gatherings with Friends and Family:   . Attends Religious Services:   . Active Member of Clubs or Organizations:   . Attends Archivist Meetings:   Marland Kitchen Marital Status:   Intimate Partner Violence:   . Fear of Current or Ex-Partner:   . Emotionally Abused:   Marland Kitchen Physically Abused:   . Sexually Abused:    PHYSICAL EXAM  Vitals:   02/12/20 1300  BP: 113/68  Pulse: 71  Weight: 132 lb (59.9 kg)  Height: 5\' 3"  (1.6 m)   Body mass index is 23.38 kg/m.  Generalized: Well developed, in no acute distress   Neurological examination  Mentation: Alert oriented to time, place, history taking. Follows all commands speech and language fluent Cranial nerve II-XII: Pupils were equal round reactive to light. Extraocular movements were full,  visual field were full on confrontational test. Facial sensation and strength were normal. Head turning and shoulder shrug  were normal and symmetric. Motor: The motor testing reveals 5 over 5 strength of all 4 extremities. Good symmetric motor tone is noted throughout.  Sensory: Sensory testing is intact to soft touch on all 4 extremities. No evidence of extinction is noted.  Coordination: Cerebellar testing reveals good finger-nose-finger and heel-to-shin bilaterally.  Gait and station: Gait is normal. Tandem gait is unsteady. Romberg is negative. No drift is seen.  Reflexes: Deep tendon reflexes are symmetric but slightly brisk in the knees   DIAGNOSTIC DATA (LABS, IMAGING, TESTING) - I reviewed patient records, labs, notes, testing and imaging myself where  available.  Lab Results  Component Value Date   WBC 5.3 09/13/2018   HGB 11.7 09/13/2018   HCT 34.8 09/13/2018   MCV 93 09/13/2018   PLT 255 09/13/2018      Component Value Date/Time   NA 139 09/13/2018 1603   K 3.9 09/13/2018 1603   CL 100 09/13/2018 1603   CO2 24 09/13/2018 1603   GLUCOSE 77 09/13/2018 1603   GLUCOSE 90 06/17/2017 1006   BUN 10 09/13/2018 1603   CREATININE 0.68 09/13/2018 1603   CALCIUM 9.8 09/13/2018 1603   PROT 6.7 09/13/2018 1603   ALBUMIN 4.5 09/13/2018 1603   AST 25 09/13/2018 1603   ALT 24 09/13/2018 1603   ALKPHOS 91 09/13/2018 1603   BILITOT <0.2 09/13/2018 1603   GFRNONAA 99 09/13/2018 1603   GFRAA 115 09/13/2018 1603   Lab Results  Component Value Date   CHOL 175 09/13/2018   HDL 61 09/13/2018   LDLCALC 93 09/13/2018   TRIG 105 09/13/2018   CHOLHDL 2.9 09/13/2018   No results found for: HGBA1C No results found for: VITAMINB12 No results found for: TSH  ASSESSMENT AND PLAN 54 y.o. year old female  has a past medical history of MS (multiple sclerosis) ([place]), Neurogenic bladder (04/07/2016), Neuromuscular disorder ([place]), and Rash. here with:  1.  Multiple sclerosis 2.  Neurogenic  bladder  She has remained overall stable.  She will remain on Rebif, baclofen, and Provigil from this office.  She receives Flomax from her PCP.  I will order MRI of the brain to ensure stability of MS and check routine blood work.  She will follow-up in 1 year or sooner if needed.  I spent 30 minutes of face-to-face and non-face-to-face time with patient.  This included previsit chart review, lab review, study review, order entry, electronic health record documentation, patient education.  Butler Denmark, AGNP-C, DNP 02/12/2020, 1:47 PM [facility] [address] [place], [place] [address] [phone]

## 2020-02-12 NOTE — Telephone Encounter (Signed)
[employer] order sent to GI. They will obtain the auth and reach out to the patient to schedule.  

## 2020-02-13 LAB — CBC WITH DIFFERENTIAL/PLATELET
Basophils Absolute: 0 10*3/uL (ref 0.0–0.2)
Basos: 0 %
EOS (ABSOLUTE): 0.1 10*3/uL (ref 0.0–0.4)
Eos: 1 %
Hematocrit: 36.6 % (ref 34.0–46.6)
Hemoglobin: 12.4 g/dL (ref 11.1–15.9)
Immature Grans (Abs): 0 10*3/uL (ref 0.0–0.1)
Immature Granulocytes: 0 %
Lymphocytes Absolute: 1.5 10*3/uL (ref 0.7–3.1)
Lymphs: 26 %
MCH: 31.4 pg (ref 26.6–33.0)
MCHC: 33.9 g/dL (ref 31.5–35.7)
MCV: 93 fL (ref 79–97)
Monocytes Absolute: 0.7 10*3/uL (ref 0.1–0.9)
Monocytes: 11 %
Neutrophils Absolute: 3.7 10*3/uL (ref 1.4–7.0)
Neutrophils: 62 %
Platelets: 220 10*3/uL (ref 150–450)
RBC: 3.95 x10E6/uL (ref 3.77–5.28)
RDW: 11.5 % — ABNORMAL LOW (ref 11.7–15.4)
WBC: 6 10*3/uL (ref 3.4–10.8)

## 2020-02-13 LAB — COMPREHENSIVE METABOLIC PANEL
ALT: 32 IU/L (ref 0–32)
AST: 36 IU/L (ref 0–40)
Albumin/Globulin Ratio: 1.9 (ref 1.2–2.2)
Albumin: 4.7 g/dL (ref 3.8–4.9)
Alkaline Phosphatase: 109 IU/L (ref 48–121)
BUN/Creatinine Ratio: 17 (ref 9–23)
BUN: 12 mg/dL (ref 6–24)
Bilirubin Total: 0.4 mg/dL (ref 0.0–1.2)
CO2: 26 mmol/L (ref 20–29)
Calcium: 9.7 mg/dL (ref 8.7–10.2)
Chloride: 104 mmol/L (ref 96–106)
Creatinine, Ser: 0.7 mg/dL (ref 0.57–1.00)
GFR calc Af Amer: 112 mL/min/{1.73_m2} (ref >59)
GFR calc non Af Amer: 97 mL/min/{1.73_m2} (ref >59)
Globulin, Total: 2.5 g/dL (ref 1.5–4.5)
Glucose: 87 mg/dL (ref 65–99)
Potassium: 4.6 mmol/L (ref 3.5–5.2)
Sodium: 143 mmol/L (ref 134–144)
Total Protein: 7.2 g/dL (ref 6.0–8.5)

## 2020-02-14 ENCOUNTER — Telehealth: Payer: Self-pay

## 2020-02-14 NOTE — Progress Notes (Signed)
I have read the note, and I agree with the clinical assessment and plan.  Devell Parkerson K Agustus Mane   

## 2020-02-14 NOTE — Telephone Encounter (Addendum)
Attempted to call the patient without success.  LM on the VM for the patient to call back re: recent results.  **If the patient calls back please relay the message below from Butler Denmark, NP  ----- Message from Suzzanne Cloud, NP sent at 02/13/2020  9:04 AM EDT ----- Labs show no significant abnormality. Overall normal. So nice to meet her yesterday :)

## 2020-02-21 ENCOUNTER — Encounter: Payer: Self-pay | Admitting: *Deleted

## 2020-02-21 NOTE — Telephone Encounter (Signed)
Sent her my chart advising of NP's message.

## 2020-02-28 ENCOUNTER — Ambulatory Visit
Admission: RE | Admit: 2020-02-28 | Discharge: 2020-02-28 | Disposition: A | Discharge status: Home / Self Care | Payer: Managed Care, Other (non HMO) | Source: Ambulatory Visit | Attending: Neurology | Admitting: Neurology

## 2020-02-28 ENCOUNTER — Other Ambulatory Visit: Payer: Self-pay

## 2020-02-28 DIAGNOSIS — G35 Multiple sclerosis: Secondary | ICD-10-CM

## 2020-02-28 MED ORDER — GADOBENATE DIMEGLUMINE 529 MG/ML IV SOLN
12.0000 mL | Freq: Once | INTRAVENOUS | Status: AC | PRN
  Administered 2020-02-28: 12 mL via INTRAVENOUS

## 2020-03-03 ENCOUNTER — Telehealth: Payer: Self-pay

## 2020-03-03 NOTE — Telephone Encounter (Signed)
-----   Message from Suzzanne Cloud, NP sent at 03/03/2020  7:57 AM EDT ----- Please call MRI of the brain is overall stable, but actual films were not available for review. She has reportedly done well on rebif.  IMPRESSION: Abnormal MRI scan of the brain with and without contrast showing nonspecific periventricular, subcortical and thalamic white matter hyperintensities.  No enhancing lesions are noted.  Probably no significant change compared with previous MRI report from 2018 though the actual films were not available for direct comparison.

## 2020-03-03 NOTE — Telephone Encounter (Signed)
Pt was notified of the message below °

## 2020-04-15 ENCOUNTER — Telehealth: Payer: Self-pay | Admitting: Family Medicine

## 2020-04-15 NOTE — Telephone Encounter (Signed)
Pt is calling in stating that she need a refill on Rx tamsulosin (FLOMAX) 0.4 MG  Ph[facility]:  [facility]

## 2020-04-16 ENCOUNTER — Other Ambulatory Visit: Payer: Self-pay

## 2020-04-16 MED ORDER — TAMSULOSIN HCL 0.4 MG PO CAPS: 0.4000 mg | ORAL_CAPSULE | Freq: Every day | ORAL | 3 refills | Status: DC

## 2020-04-16 NOTE — Telephone Encounter (Signed)
Rx refilled pt notified of update

## 2020-05-08 ENCOUNTER — Encounter: Payer: Self-pay | Admitting: Neurology

## 2020-05-12 ENCOUNTER — Telehealth (INDEPENDENT_AMBULATORY_CARE_PROVIDER_SITE_OTHER): Payer: Managed Care, Other (non HMO) | Admitting: Family Medicine

## 2020-05-12 ENCOUNTER — Encounter: Payer: Self-pay | Admitting: Family Medicine

## 2020-05-12 ENCOUNTER — Other Ambulatory Visit: Payer: Self-pay

## 2020-05-12 ENCOUNTER — Telehealth: Payer: Self-pay | Admitting: Family Medicine

## 2020-05-12 VITALS — Ht 63.0 in | Wt 132.0 lb

## 2020-05-12 DIAGNOSIS — U071 COVID-19: Secondary | ICD-10-CM | POA: Diagnosis not present

## 2020-05-12 MED ORDER — BENZONATATE 100 MG PO CAPS
100.0000 mg | ORAL_CAPSULE | Freq: Three times a day (TID) | ORAL | 0 refills | Status: DC | PRN
Start: 2020-05-12 — End: 2020-09-22

## 2020-05-12 NOTE — Progress Notes (Signed)
Patient ID: Rebecca Mejia, female   DOB: 1966/08/01, 54 y.o.   MRN: [id number]  This visit type was conducted due to national recommendations for restrictions regarding the COVID-19 pandemic in an effort to limit this patient's exposure and mitigate transmission in our community.   Virtual Visit via Video Note  I connected with [name] on 05/12/20 at  3:00 PM EDT by a video enabled telemedicine application and verified that I am speaking with the correct person using two identifiers.  Location patient: home Location provider:work or home office Persons participating in the virtual visit: patient, provider  I discussed the limitations of evaluation and management by telemedicine and the availability of in person appointments. The patient expressed understanding and agreed to proceed.   HPI:  Rebecca Mejia tested positive for Covid on the 21st of this month.  She actually had onset of symptoms back around 11th or 12th of this month.  She developed some fatigue and subsequently developed some nasal congestion and cough.  Has no fever.  She has had some loss of taste and smell.  She has had increased malaise and body aches.  Her cough is probably the most bothersome now but she is not having any significant dyspnea.  She has passed 10-day quarantine and unfortunately past 10-day window for monoclonal antibody infusion.  Her biggest thing is the cough and malaise.  She does have underlying problem of MS  ROS: See pertinent positives and negatives per HPI.  Past Medical History:  Diagnosis Date  . MS (multiple sclerosis) ([place])   . Neurogenic bladder 04/07/2016  . Neuromuscular disorder ([place])    MS  . Rash    patient reports red, flat rash that is now "cyling out"     Past Surgical History:  Procedure Laterality Date  . ABDOMINAL HYSTERECTOMY    . arm surgery  2014   broken left wrist after fall from side of motunain while horse riding;  still has rods in wrist   . CYSTOSCOPY N/A  06/21/2017   Procedure: CYSTOSCOPY;  Surgeon: Salvadore Dom, MD;  [facility];  Service: Gynecology;  Laterality: N/A;  . KNEE SURGERY  1998   reconstructive ACL   . NOSE SURGERY  2013   broken nose after fall from horse   . TOTAL LAPAROSCOPIC HYSTERECTOMY WITH SALPINGECTOMY Bilateral 06/21/2017   Procedure: TOTAL LAPAROSCOPIC HYSTERECTOMY WITH SALPINGECTOMY;  Surgeon: Salvadore Dom, MD;  Location: [facility];  Service: Gynecology;  Laterality: Bilateral;    Family History  Problem Relation Age of Onset  . Heart attack Mother   . Breast cancer Sister   . Osteoporosis Sister   . Breast cancer Maternal Grandmother     SOCIAL HX: Non-smoker   Current Outpatient Medications:  .  baclofen (LIORESAL) 10 MG tablet, TAKE ONE TABLET BY MOUTH TWO TIMES A DAY AS NEEDED FOR MUSCLE SPASMS., Disp: 180 tablet, Rfl: 2 .  Interferon Beta-1a (REBIF) 22 MCG/0.5ML SOSY, Inject 22 mcg into the skin 3 (three) times a week., Disp: 12 mL, Rfl: 4 .  modafinil (PROVIGIL) 100 MG tablet, Take 1 tablet (100 mg total) by mouth daily., Disp: 90 tablet, Rfl: 1 .  tamsulosin (FLOMAX) 0.4 MG CAPS capsule, Take 1 capsule (0.4 mg total) by mouth daily., Disp: 90 capsule, Rfl: 3  EXAM:  VITALS per patient if applicable:  GENERAL: alert, oriented, appears well and in no acute distress  HEENT: atraumatic, conjunttiva clear, no obvious abnormalities on inspection of external  nose and ears  NECK: normal movements of the head and neck  LUNGS: on inspection no signs of respiratory distress, breathing rate appears normal, no obvious gross SOB, gasping or wheezing  CV: no obvious cyanosis  MS: moves all visible extremities without noticeable abnormality  PSYCH/NEURO: pleasant and cooperative, no obvious depression or anxiety, speech and thought processing grossly intact  ASSESSMENT AND PLAN:  Discussed the following assessment and plan:  Positive COVID-19  infection.  Her positive result came back on the 23rd of this month but she had onset of symptoms prior to the 13th.  She is past the window for monoclonal antibody infusion.  She is overall doing fairly well except for some malaise and persistent cough  -Tessalon Perles 100 mg every 8 hours as needed for cough -Follow-up immediately for any fever or other concerns -We explained that her taste and smell could take months to come back or possibly may not come back either in full or at all but hopefully this would still recover     I discussed the assessment and treatment plan with the patient. The patient was provided an opportunity to ask questions and all were answered. The patient agreed with the plan and demonstrated an understanding of the instructions.   The patient was advised to call back or seek an in-person evaluation if the symptoms worsen or if the condition fails to improve as anticipated.     Carolann Littler, MD

## 2020-05-16 ENCOUNTER — Telehealth: Payer: Managed Care, Other (non HMO)

## 2020-09-22 ENCOUNTER — Ambulatory Visit: Payer: Managed Care, Other (non HMO) | Admitting: Obstetrics and Gynecology

## 2020-09-22 ENCOUNTER — Encounter: Payer: Self-pay | Admitting: Obstetrics and Gynecology

## 2020-09-22 ENCOUNTER — Other Ambulatory Visit: Payer: Self-pay

## 2020-09-22 VITALS — BP 114/70 | HR 70 | Resp 16 | Wt 134.0 lb

## 2020-09-22 DIAGNOSIS — R339 Retention of urine, unspecified: Secondary | ICD-10-CM

## 2020-09-22 DIAGNOSIS — M533 Sacrococcygeal disorders, not elsewhere classified: Secondary | ICD-10-CM

## 2020-09-22 DIAGNOSIS — K5900 Constipation, unspecified: Secondary | ICD-10-CM | POA: Insufficient documentation

## 2020-09-22 MED ORDER — IBUPROFEN 800 MG PO TABS: 800.0000 mg | ORAL_TABLET | Freq: Three times a day (TID) | ORAL | 1 refills | Status: DC | PRN

## 2020-09-22 NOTE — Patient Instructions (Signed)
Acute Urinary Retention, Female  Acute urinary retention is when a person cannot pee (urinate) at all, or can only pee a little. This can come on all of a sudden. If it is not treated, it can lead to kidney problems or other serious problems. What are the causes?  A problem with the tube that drains the bladder (urethra).  Problems with the nerves in the bladder.  The organs in the area between your hip bones (pelvis) slipping out of place (prolapse).  Tumors.  The birth of a baby through the vagina.  An infection.  Having trouble pooping (constipation).  Certain medicines. What increases the risk? Women over age 50 are more at risk. Other conditions also can increase risk. These include:  Diseases, such as multiple sclerosis.  Injury to the spinal cord.  Diabetes.  A condition that affects the way the brain works, such as dementia.  Holding back urine due to trauma or because you do not want to use the bathroom.  History of not being able to pee or peeing too little.  Having had surgery in the area between your hip bones. What are the signs or symptoms?  Trouble peeing.  Pain in the lower belly. How is this treated? Treatment for this condition may include:  Medicines.  Placing a thin, germ-free tube (catheter) into the bladder to drain pee out of the body.  Therapy to treat mental health conditions.  Treatment for conditions that may cause this. If needed, you may be treated in the hospital for kidney problems or to manage other problems. Follow these instructions at home: Medicines  Take over-the-counter and prescription medicines only as told by your doctor. Ask your doctor what medicines you should stay away from.  If you were given an antibiotic medicine, take it as told by your doctor. Do not stop taking it even if you start to feel better. General instructions  Do not smoke or use any products that contain nicotine or tobacco. If you need help  quitting, ask your doctor.  Drink enough fluid to keep your pee pale yellow.  If you were sent home with a tube that drains the bladder, take care of it as told by your doctor.  Watch for changes in your symptoms. Tell your doctor about them.  If told, keep track of changes in your blood pressure at home. Tell your doctor about them.  Keep all follow-up visits. Contact a doctor if:  You have spasms in your bladder that you cannot stop.  You leak pee when you have spasms. Get help right away if:  You have chills or a fever.  You have blood in your pee.  You have a tube that drains pee from the bladder and these things happen: ? The tube stops draining pee. ? The tube falls out. Summary  Acute urinary retention is when you cannot pee at all or you pee too little.  If this is not treated, it can cause kidney problems or other serious problems.  If you were sent home with a tube (catheter) that drains pee from the bladder, take care of it as told by your doctor.  Watch for changes in your symptoms. Tell your doctor about them. This information is not intended to replace advice given to you by your health care provider. Make sure you discuss any questions you have with your health care provider. Document Revised: 04/23/2020 Document Reviewed: 04/23/2020 Elsevier Patient Education  2021 Elsevier Inc.  

## 2020-09-22 NOTE — Progress Notes (Addendum)
. GYNECOLOGY  VISIT   HPI: 55 y.o.   Married White or Caucasian Not Hispanic or Latino  female   G0P0000 with Patient's last menstrual period was 05/03/2017.   here for pain near buttocks.  The patient c/o a 2 week h/o pain at the base of her tailbone. It hurts worst when she gets up in the morning. Gets a little better as the day goes on. Her husband noticed a red spot in between her upper buttocks. No abdominal pain.  She has MS, has trouble emptying her bladder, on flomax. Occasionally self caths (husband does it).  She also has urge incontinence.   GYNECOLOGIC HISTORY: Patient's last menstrual period was 05/03/2017. Contraception: hysterectomy & husband vasectomy Menopausal hormone therapy: none        OB History    Gravida  0   Para  0   Term  0   Preterm  0   AB  0   Living  0     SAB  0   IAB  0   Ectopic  0   Multiple  0   Live Births  0              Patient Active Problem List   Diagnosis Date Noted  . Status post laparoscopic hysterectomy 06/21/2017  . Multiple sclerosis ([place]) 04/07/2016  . Neurogenic bladder 04/07/2016    Past Medical History:  Diagnosis Date  . MS (multiple sclerosis) ([place])   . Neurogenic bladder 04/07/2016  . Neuromuscular disorder ([place])    MS  . Rash    patient reports red, flat rash that is now "cyling out"     Past Surgical History:  Procedure Laterality Date  . ABDOMINAL HYSTERECTOMY    . arm surgery  2014   broken left wrist after fall from side of [place]unain while horse riding;  still has rods in wrist   . CYSTOSCOPY N/A 06/21/2017   Procedure: CYSTOSCOPY;  Surgeon: Salvadore Dom, MD;  Location: [facility];  Service: Gynecology;  Laterality: N/A;  . KNEE SURGERY  1998   reconstructive ACL   . NOSE SURGERY  2013   broken nose after fall from horse   . TOTAL LAPAROSCOPIC HYSTERECTOMY WITH SALPINGECTOMY Bilateral 06/21/2017   Procedure: TOTAL LAPAROSCOPIC HYSTERECTOMY WITH SALPINGECTOMY;   Surgeon: Salvadore Dom, MD;  Location[facility];  Service: Gynecology;  Laterality: Bilateral;    Current Outpatient Medications  Medication Sig Dispense Refill  . baclofen (LIORESAL) 10 MG tablet TAKE ONE TABLET BY MOUTH TWO TIMES A DAY AS NEEDED FOR MUSCLE SPASMS. 180 tablet 2  . Interferon Beta-1a (REBIF) 22 MCG/0.5ML SOSY Inject 22 mcg into the skin 3 (three) times a week. 12 mL 4  . modafinil (PROVIGIL) 100 MG tablet Take 1 tablet (100 mg total) by mouth daily. 90 tablet 1  . tamsulosin (FLOMAX) 0.4 MG CAPS capsule Take 1 capsule (0.4 mg total) by mouth daily. 90 capsule 3   No current facility-administered medications for this visit.     ALLERGIES: Codeine  Family History  Problem Relation Age of Onset  . Heart attack Mother   . Breast cancer Sister   . Osteoporosis Sister   . Breast cancer Maternal Grandmother     Social History   Socioeconomic History  . Marital status: Married    Spouse name: Not on file  . Number of children: Not on file  . Years of education: Not on file  . Highest  education level: Not on file  Occupational History  . Not on file  Tobacco Use  . Smoking status: Never Smoker  . Smokeless tobacco: Never Used  Vaping Use  . Vaping Use: Never used  Substance and Sexual Activity  . Alcohol use: Yes    Comment: 1 a day  . Drug use: No  . Sexual activity: Yes    Partners: Male    Birth control/protection: None    Comment: husband vasectomy, hysterectomy  Other Topics Concern  . Not on file  Social History Narrative  . Not on file   Social Determinants of Health   Financial Resource Strain: Not on file  Food Insecurity: Not on file  Transportation Needs: Not on file  Physical Activity: Not on file  Stress: Not on file  Social Connections: Not on file  Intimate Partner Violence: Not on file    Review of Systems  Constitutional: Negative.   HENT: Negative.   Eyes: Negative.   Respiratory: Negative.    Cardiovascular: Negative.   Gastrointestinal: Negative.   Genitourinary:       Pain near buttocks  Musculoskeletal: Negative.   Skin: Negative.   Neurological: Negative.   Endo/Heme/Allergies: Negative.   Psychiatric/Behavioral: Negative.     PHYSICAL EXAMINATION:    BP 114/70   Pulse 70   Resp 16   Wt 134 lb (60.8 kg)   LMP 05/03/2017   BMI 23.74 kg/m     General appearance: alert, cooperative and appears stated age Coccyx: not tender, no cyst or abscess Pelvic: External genitalia:  no lesions              Urethra:  normal appearing urethra with no masses, tenderness or lesions              Bartholins and Skenes: normal                 Cervix: absent              Bimanual Exam:  Uterus:  uterus absent and no masses or tenderness              Adnexa: no mass, fullness, tenderness              Rectovaginal: Yes.  .  Confirms.              Anus:  normal sphincter tone, no lesions  Pelvic floor: minimally tender on the left.  Bladder felt full, not tender  The patient voided, then straight catheterized using sterile technique.  PVR: 250 cc  1. Tail bone pain Discussed using ice or heat, avoiding pressure on her coccyx. Information from UTD given.  - ibuprofen (ADVIL) 800 MG tablet; Take 1 tablet (800 mg total) by mouth every 8 (eight) hours as needed.  Dispense: 30 tablet; Refill: 1  2. Urinary retention PVR of 250 cc Discussed st cathetering 1-2 x a day (will confirm with Neurology that they agree)  Over 20 minutes in total patient care.   Addendum: reviewed with Butler Denmark, NP in Neurology and she agreed. Recommended the patient return to Urology.

## 2020-10-14 ENCOUNTER — Encounter: Payer: Self-pay | Admitting: Family Medicine

## 2020-10-14 LAB — HM COLONOSCOPY

## 2020-10-23 ENCOUNTER — Other Ambulatory Visit: Payer: Self-pay | Admitting: Neurology

## 2020-10-23 MED ORDER — MODAFINIL 100 MG PO TABS: 100.0000 mg | ORAL_TABLET | Freq: Every day | ORAL | 1 refills | Status: DC

## 2020-10-23 NOTE — Telephone Encounter (Signed)
Pt has called for a refill on her modafinil (PROVIGIL) 100 MG tablet to [facility] [phone]

## 2020-10-23 NOTE — Addendum Note (Signed)
Addended by: Florian Buff C on: 10/23/2020 11:19 AM   Modules accepted: Orders

## 2020-10-24 ENCOUNTER — Encounter: Payer: Self-pay | Admitting: Obstetrics and Gynecology

## 2020-10-24 ENCOUNTER — Other Ambulatory Visit: Payer: Self-pay

## 2020-10-24 ENCOUNTER — Ambulatory Visit (INDEPENDENT_AMBULATORY_CARE_PROVIDER_SITE_OTHER): Payer: Managed Care, Other (non HMO) | Admitting: Obstetrics and Gynecology

## 2020-10-24 VITALS — BP 120/76 | HR 65 | Ht 62.5 in | Wt 131.0 lb

## 2020-10-24 DIAGNOSIS — K5730 Diverticulosis of large intestine without perforation or abscess without bleeding: Secondary | ICD-10-CM | POA: Insufficient documentation

## 2020-10-24 DIAGNOSIS — Z01419 Encounter for gynecological examination (general) (routine) without abnormal findings: Secondary | ICD-10-CM

## 2020-10-24 DIAGNOSIS — Z0000 Encounter for general adult medical examination without abnormal findings: Secondary | ICD-10-CM | POA: Diagnosis not present

## 2020-10-24 DIAGNOSIS — M533 Sacrococcygeal disorders, not elsewhere classified: Secondary | ICD-10-CM | POA: Insufficient documentation

## 2020-10-24 NOTE — Patient Instructions (Signed)

## 2020-10-24 NOTE — Progress Notes (Signed)
55 y.o. G0P0000 Married White or Caucasian Not Hispanic or Latino female here for annual exam.  H/O hysterectomy  She was seen last month with tailbone pain. Mostly better.   H/O MS, has some urinary retention. Self catheterizing some  Ongoing issues with constipation.     Patient's last menstrual period was 05/03/2017.          Sexually active: Yes.    The current method of family planning is hysterectomy & husband vasectomy.    Exercising: Yes.    farming Smoker:  No  Health Maintenance: Pap:  06/02/17 NEG  History of abnormal Pap:  no MMG:  MMG 10/05/18, U/S L-BREAST 10/11/18 BMD:   none Colonoscopy: MAR/1/22 TDaP:  09/13/2018 Gardasil: N/A   reports that she has never smoked. She has never used smokeless tobacco. She reports current alcohol use. She reports that she does not use drugs. 1 glass of wine at night.   Past Medical History:  Diagnosis Date  . MS (multiple sclerosis) ([place])   . Neurogenic bladder 04/07/2016  . Neuromuscular disorder ([place])    MS  . Rash    patient reports red, flat rash that is now "cyling out"     Past Surgical History:  Procedure Laterality Date  . ABDOMINAL HYSTERECTOMY    . arm surgery  2014   broken left wrist after fall from side of [place]unain while horse riding;  still has rods in wrist   . CYSTOSCOPY N/A 06/21/2017   Procedure: CYSTOSCOPY;  Surgeon: Salvadore Dom, MD;  Location: [facility];  Service: Gynecology;  Laterality: N/A;  . KNEE SURGERY  1998   reconstructive ACL   . NOSE SURGERY  2013   broken nose after fall from horse   . TOTAL LAPAROSCOPIC HYSTERECTOMY WITH SALPINGECTOMY Bilateral 06/21/2017   Procedure: TOTAL LAPAROSCOPIC HYSTERECTOMY WITH SALPINGECTOMY;  Surgeon: Salvadore Dom, MD;  Location: [facility];  Service: Gynecology;  Laterality: Bilateral;    Current Outpatient Medications  Medication Sig Dispense Refill  . baclofen (LIORESAL) 10 MG tablet TAKE ONE TABLET BY MOUTH  TWO TIMES A DAY AS NEEDED FOR MUSCLE SPASMS. 180 tablet 2  . ibuprofen (ADVIL) 800 MG tablet Take 1 tablet (800 mg total) by mouth every 8 (eight) hours as needed. 30 tablet 1  . Interferon Beta-1a (REBIF) 22 MCG/0.5ML SOSY Inject 22 mcg into the skin 3 (three) times a week. 12 mL 4  . modafinil (PROVIGIL) 100 MG tablet Take 1 tablet (100 mg total) by mouth daily. 90 tablet 1  . tamsulosin (FLOMAX) 0.4 MG CAPS capsule Take 1 capsule (0.4 mg total) by mouth daily. 90 capsule 3   No current facility-administered medications for this visit.    Family History  Problem Relation Age of Onset  . Heart attack Mother   . Breast cancer Sister   . Osteoporosis Sister   . Breast cancer Maternal Grandmother     Review of Systems  Constitutional: Negative.   HENT: Negative.   Eyes: Negative.   Respiratory: Negative.   Cardiovascular: Negative.   Gastrointestinal: Negative.   Endocrine: Negative.   Genitourinary: Negative.   Musculoskeletal: Negative.   Skin: Negative.   Allergic/Immunologic: Negative.   Neurological: Negative.   Hematological: Negative.   Psychiatric/Behavioral: Negative.     Exam:   BP 120/76   Pulse 65   Ht 5' 2.5" (1.588 m)   Wt 131 lb (59.4 kg)   LMP 05/03/2017   SpO2 98%   BMI  23.58 kg/m   Weight change: @WEIGHTCHANGE @ Height:   Height: 5' 2.5" (158.8 cm)  Ht Readings from Last 3 Encounters:  10/24/20 5' 2.5" (1.588 m)  05/12/20 5\' 3"  (1.6 m)  02/12/20 5\' 3"  (1.6 m)    General appearance: alert, cooperative and appears stated age Head: Normocephalic, without obvious abnormality, atraumatic Neck: no adenopathy, supple, symmetrical, trachea midline and thyroid normal to inspection and palpation Lungs: clear to auscultation bilaterally Cardiovascular: regular rate and rhythm Breasts: normal appearance, no masses or tenderness Abdomen: soft, non-tender; non distended,  no masses,  no organomegaly Extremities: extremities normal, atraumatic, no cyanosis or  edema Skin: Skin color, texture, turgor normal. No rashes or lesions Lymph nodes: Cervical, supraclavicular, and axillary nodes normal. No abnormal inguinal nodes palpated Neurologic: Grossly normal   Pelvic: External genitalia:  no lesions              Urethra:  normal appearing urethra with no masses, tenderness or lesions              Bartholins and Skenes: normal                 Vagina: normal               Cervix: absent               Bimanual Exam:  Uterus:  uterus absent              Adnexa: no mass, fullness, tenderness               Rectovaginal: Confirms               Anus:  normal sphincter tone, no lesions  Pelvic floor: not tender  Bladder: full (h/o retention)  1. Well woman exam Discussed breast self exam Discussed calcium and vit D intake Mammogram overdue, # given Colonoscopy UTD No pap needed  2. Laboratory exam ordered as part of routine general medical examination - CBC - Comprehensive metabolic panel - Lipid panel

## 2020-10-25 LAB — COMPREHENSIVE METABOLIC PANEL
AG Ratio: 2 (calc) (ref 1.0–2.5)
ALT: 24 U/L (ref 6–29)
AST: 29 U/L (ref 10–35)
Albumin: 4.7 g/dL (ref 3.6–5.1)
Alkaline phosphatase (APISO): 85 U/L (ref 37–153)
BUN: 17 mg/dL (ref 7–25)
CO2: 28 mmol/L (ref 20–32)
Calcium: 10 mg/dL (ref 8.6–10.4)
Chloride: 102 mmol/L (ref 98–110)
Creat: 0.85 mg/dL (ref 0.50–1.05)
Globulin: 2.4 g/dL (calc) (ref 1.9–3.7)
Glucose, Bld: 82 mg/dL (ref 65–99)
Potassium: 3.9 mmol/L (ref 3.5–5.3)
Sodium: 139 mmol/L (ref 135–146)
Total Bilirubin: 0.3 mg/dL (ref 0.2–1.2)
Total Protein: 7.1 g/dL (ref 6.1–8.1)

## 2020-10-25 LAB — CBC
HCT: 35.8 % (ref 35.0–45.0)
Hemoglobin: 12.1 g/dL (ref 11.7–15.5)
MCH: 30.7 pg (ref 27.0–33.0)
MCHC: 33.8 g/dL (ref 32.0–36.0)
MCV: 90.9 fL (ref 80.0–100.0)
MPV: 11.2 fL (ref 7.5–12.5)
Platelets: 268 10*3/uL (ref 140–400)
RBC: 3.94 10*6/uL (ref 3.80–5.10)
RDW: 11.4 % (ref 11.0–15.0)
WBC: 5.5 10*3/uL (ref 3.8–10.8)

## 2020-10-25 LAB — LIPID PANEL
Cholesterol: 184 mg/dL (ref <200)
HDL: 58 mg/dL (ref >=50)
LDL Cholesterol (Calc): 109 mg/dL (calc) — ABNORMAL HIGH
Non-HDL Cholesterol (Calc): 126 mg/dL (calc) (ref <130)
Total CHOL/HDL Ratio: 3.2 (calc) (ref <5.0)
Triglycerides: 81 mg/dL (ref <150)

## 2020-10-30 ENCOUNTER — Telehealth: Payer: Self-pay | Admitting: Emergency Medicine

## 2020-10-30 NOTE — Telephone Encounter (Signed)
PA for Rebif started on [facility]: [id number] Drug is covered by current benefit plan. No further PA activity needed

## 2021-02-12 ENCOUNTER — Encounter: Payer: Self-pay | Admitting: Neurology

## 2021-02-12 ENCOUNTER — Ambulatory Visit: Payer: Managed Care, Other (non HMO) | Admitting: Neurology

## 2021-02-12 VITALS — BP 144/73 | HR 72 | Ht 63.0 in | Wt 130.0 lb

## 2021-02-12 DIAGNOSIS — G35 Multiple sclerosis: Secondary | ICD-10-CM | POA: Diagnosis not present

## 2021-02-12 MED ORDER — BACLOFEN 10 MG PO TABS: ORAL_TABLET | ORAL | 2 refills | Status: DC

## 2021-02-12 MED ORDER — REBIF 22 MCG/0.5ML ~~LOC~~ SOSY: 22.0000 ug | PREFILLED_SYRINGE | SUBCUTANEOUS | 4 refills | Status: DC

## 2021-02-12 NOTE — Progress Notes (Signed)
I have read the note, and I agree with the clinical assessment and plan.  Abbagayle Zaragoza K Rindy Kollman   

## 2021-02-12 NOTE — Progress Notes (Signed)
PATIENT: Rebecca Mejia DOB: 20-Feb-1966  REASON FOR VISIT: follow up HISTORY FROM: patient Primary Neurologist: Dr. Jannifer Franklin   HISTORY OF PRESENT ILLNESS: Today 02/12/21  Rebecca Mejia is a 55 year old female with history of MS on rebif.  Receives baclofen, Provigil from this office. Doing well, feels stable. Balance is slightly off at baseline, no falls. Takes baclofen PRN for legs. Takes 1/2 of Provigil as needed. 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 Flomax works well for urinary frequency, urgency, helps her empty her bladder. Does catheterizations periodically. Hasn't been back to see urology. Here today alone. Has no complaints.   Update 02/12/2020 SS: Ms. Zuluaga is a 55 year old female with history of multiple sclerosis.  She has neurogenic bladder.  She is on Rebif, Flomax, Provigil, and baclofen.  Her condition remains overall stable.  She does have issues with balance, bladder, muscle spasms, fatigue, and cognitive effects secondary to MS.  The Flomax has been quite helpful, used to have frequent urinary accidents, also UTIs.  She will occasionally do intermittent catheterizations. Takes baclofen PRN for muscle spasm.  She has several horses at her home, has a horse hauling business, she is quite active.  Denies any overall changes in her MS symptoms.  Presents today for evaluation unaccompanied.  HISTORY 01/31/2019 Dr. Jannifer Franklin: Rebecca Mejia is a 55 year old right-handed white female with a history of multiple sclerosis.  The patient has a neurogenic bladder associated with this.  She recently was placed on Flomax which has been of some benefit to void the bladder.  The patient did have a recent urinary tract infection.  The patient has not had any new numbness or weakness of the face, arms, or legs.  She is on Rebif, she tolerates the medication well.  She denies any significant balance issues.  She denies any vision  changes.  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  REVIEW OF SYSTEMS: Out of a complete 14 system review of symptoms, the patient complains only of the following symptoms, and all other reviewed systems are negative.  Fatigue  ALLERGIES: Allergies  Allergen Reactions   Codeine Hives and Itching    HOME MEDICATIONS: Outpatient Medications Prior to Visit  Medication Sig Dispense Refill   modafinil (PROVIGIL) 100 MG tablet Take 1 tablet (100 mg total) by mouth daily. 90 tablet 1   tamsulosin (FLOMAX) 0.4 MG CAPS capsule Take 1 capsule (0.4 mg total) by mouth daily. 90 capsule 3   baclofen (LIORESAL) 10 MG tablet TAKE ONE TABLET BY MOUTH TWO TIMES A DAY AS NEEDED FOR MUSCLE SPASMS. 180 tablet 2   Interferon Beta-1a (REBIF) 22 MCG/0.5ML SOSY Inject 22 mcg into the skin 3 (three) times a week. 12 mL 4   ibuprofen (ADVIL) 800 MG tablet Take 1 tablet (800 mg total) by mouth every 8 (eight) hours as needed. 30 tablet 1   No facility-administered medications prior to visit.    PAST MEDICAL HISTORY: Past Medical History:  Diagnosis Date   MS (multiple sclerosis) ([place])    Neurogenic bladder 04/07/2016   Neuromuscular disorder ([facility])    MS   Rash    patient reports red, flat rash that is now "cyling out"     PAST SURGICAL HISTORY: Past Surgical History:  Procedure Laterality Date   ABDOMINAL HYSTERECTOMY     arm surgery  2014   broken left wrist after fall from side of motunain  while horse riding;  still has rods in wrist    CYSTOSCOPY N/A 06/21/2017   Procedure: CYSTOSCOPY;  Surgeon: Salvadore Dom, MD;  Location: [facility];  Service: Gynecology;  Laterality: N/A;   KNEE SURGERY  1998   reconstructive ACL    NOSE SURGERY  2013   broken nose after fall from horse    TOTAL LAPAROSCOPIC HYSTERECTOMY WITH SALPINGECTOMY Bilateral 06/21/2017   Procedure: TOTAL LAPAROSCOPIC HYSTERECTOMY WITH SALPINGECTOMY;  Surgeon: Salvadore Dom, MD;   Location: [facility];  Service: Gynecology;  Laterality: Bilateral;    FAMILY HISTORY: Family History  Problem Relation Age of Onset   Heart attack Mother    Breast cancer Sister    Osteoporosis Sister    Breast cancer Maternal Grandmother     SOCIAL HISTORY: Social History   Socioeconomic History   Marital status: Married    Spouse name: Not on file   Number of children: Not on file   Years of education: Not on file   Highest education level: Not on file  Occupational History   Not on file  Tobacco Use   Smoking status: Never   Smokeless tobacco: Never  Vaping Use   Vaping Use: Never used  Substance and Sexual Activity   Alcohol use: Yes    Comment: 1 a day   Drug use: No   Sexual activity: Yes    Partners: Male    Birth control/protection: None    Comment: husband vasectomy, hysterectomy  Other Topics Concern   Not on file  Social History Narrative   Not on file   Social Determinants of Health   Financial Resource Strain: Not on file  Food Insecurity: Not on file  Transportation Needs: Not on file  Physical Activity: Not on file  Stress: Not on file  Social Connections: Not on file  Intimate Partner Violence: Not on file   PHYSICAL EXAM  Vitals:   02/12/21 1312  BP: (!) 144/73  Pulse: 72  Weight: 130 lb (59 kg)  Height: 5\' 3"  (1.6 m)    Body mass index is 23.03 kg/m.  Generalized: Well developed, in no acute distress   Neurological examination  Mentation: Alert oriented to time, place, history taking. Follows all commands speech and language fluent Cranial nerve II-XII: Pupils were equal round reactive to light. Extraocular movements were full, visual field were full on confrontational test. Facial sensation and strength were normal. Head turning and shoulder shrug  were normal and symmetric. Motor: The motor testing reveals 5 over 5 strength of all 4 extremities. Good symmetric motor tone is noted throughout.  Sensory: Sensory  testing is intact to soft touch on all 4 extremities. No evidence of extinction is noted.  Coordination: Cerebellar testing reveals good finger-nose-finger and heel-to-shin bilaterally.  Gait and station: Gait is normal. Tandem gait is unsteady.  Reflexes: Deep tendon reflexes are symmetric and normal  DIAGNOSTIC DATA (LABS, IMAGING, TESTING) - I reviewed patient records, labs, notes, testing and imaging myself where available.  Lab Results  Component Value Date   WBC 5.5 10/24/2020   HGB 12.1 10/24/2020   HCT 35.8 10/24/2020   MCV 90.9 10/24/2020   PLT 268 10/24/2020      Component Value Date/Time   NA 139 10/24/2020 1413   NA 143 02/12/2020 1347   K 3.9 10/24/2020 1413   CL 102 10/24/2020 1413   CO2 28 10/24/2020 1413   GLUCOSE 82 10/24/2020 1413   BUN 17 10/24/2020  1413   BUN 12 02/12/2020 1347   CREATININE 0.85 10/24/2020 1413   CALCIUM 10.0 10/24/2020 1413   PROT 7.1 10/24/2020 1413   PROT 7.2 02/12/2020 1347   ALBUMIN 4.7 02/12/2020 1347   AST 29 10/24/2020 1413   ALT 24 10/24/2020 1413   ALKPHOS 109 02/12/2020 1347   BILITOT 0.3 10/24/2020 1413   BILITOT 0.4 02/12/2020 1347   GFRNONAA 97 02/12/2020 1347   GFRAA 112 02/12/2020 1347   Lab Results  Component Value Date   CHOL 184 10/24/2020   HDL 58 10/24/2020   LDLCALC 109 (H) 10/24/2020   TRIG 81 10/24/2020   CHOLHDL 3.2 10/24/2020   No results found for: HGBA1C No results found for: VITAMINB12 No results found for: TSH  ASSESSMENT AND PLAN 55 y.o. year old female  has a past medical history of MS (multiple sclerosis) ([place]), Neurogenic bladder (04/07/2016), Neuromuscular disorder ([place]), and Rash. here with:  1.  Multiple sclerosis 2.  Neurogenic bladder  -Remains stable, doing well  -Continue current medications (rebif, flomax, provigil, baclofen), provigil refilled 10/23/20 # 90, not due for refill yet -MRI brain was stable in July 2021, Dr. Leonie Man reviewed films from 2018 to 2021, which showed no new  lesions -CBC, CMP in March 2022 were normal -Follow-up in 1 year or sooner if needed  Butler Denmark, Laqueta Jean, DNP 02/12/2021, 1:41 PM [facility] [address], [place], [place] [address] [phone]

## 2021-02-12 NOTE — Patient Instructions (Signed)
Continue current medications  Reviewed labs  See you back in 1 year

## 2021-05-12 ENCOUNTER — Other Ambulatory Visit: Payer: Self-pay | Admitting: Family Medicine

## 2021-05-12 NOTE — Telephone Encounter (Signed)
PT called to advise that they need a refill of their tamsulosin (FLOMAX) 0.4 MG CAPS capsule as they are currently out of the medication. Please advise and refill at the [facility] on file.

## 2021-05-13 ENCOUNTER — Other Ambulatory Visit: Payer: Self-pay

## 2021-05-13 ENCOUNTER — Telehealth: Payer: Self-pay

## 2021-05-13 MED ORDER — TAMSULOSIN HCL 0.4 MG PO CAPS: 0.4000 mg | ORAL_CAPSULE | Freq: Every day | ORAL | 3 refills | Status: DC

## 2021-05-13 NOTE — Telephone Encounter (Signed)
Noted  

## 2021-05-13 NOTE — Telephone Encounter (Signed)
Patient called requesting Rx refill tamsulosin (FLOMAX) 0.4 MG CAPS capsule Rx was sent to the pharmacy

## 2021-07-06 ENCOUNTER — Telehealth: Payer: Self-pay | Admitting: Neurology

## 2021-07-06 MED ORDER — MODAFINIL 100 MG PO TABS: 100.0000 mg | ORAL_TABLET | Freq: Every day | ORAL | 1 refills | Status: DC

## 2021-07-06 NOTE — Addendum Note (Signed)
Addended by: Andre Lefort on: 07/06/2021 04:37 PM   Modules accepted: Orders

## 2021-07-06 NOTE — Telephone Encounter (Addendum)
[place] [facility] Verified LR:10/23/2020 Qty: 47 for 90 days Last OV:02-12-21 Pending appointment:  02/25/22

## 2021-07-06 NOTE — Addendum Note (Signed)
Addended by: Marcial Pacas on: 07/06/2021 05:26 PM   Modules accepted: Orders

## 2021-07-06 NOTE — Addendum Note (Signed)
Addended by: Andre Lefort on: 07/06/2021 04:10 PM   Modules accepted: Orders

## 2021-07-06 NOTE — Telephone Encounter (Signed)
Pt states her insurance is asking that a prescription for her modafinil (PROVIGIL) 100 MG tablet now be sent to [facility]

## 2021-07-13 ENCOUNTER — Other Ambulatory Visit: Payer: Self-pay | Admitting: *Deleted

## 2021-07-13 MED ORDER — MODAFINIL 100 MG PO TABS: 100.0000 mg | ORAL_TABLET | Freq: Every day | ORAL | 1 refills | Status: DC

## 2021-09-17 ENCOUNTER — Telehealth: Payer: Self-pay

## 2021-09-17 DIAGNOSIS — Z0289 Encounter for other administrative examinations: Secondary | ICD-10-CM

## 2021-09-17 NOTE — Telephone Encounter (Signed)
FMLA paper work on behalf of the pt's spouse has been completed and placed on Rebecca Mejia's desk for review/signature if appropriate.

## 2021-11-02 NOTE — Progress Notes (Signed)
56 y.o. Rebecca Mejia Married White or Caucasian Not Hispanic or Latino female here for annual exam.  H/O TLH/BS in 11/18. No dyspareunia. ? ?She stopped her treatment for her MS about a year ago and has not had any MS symptoms. She states that she no longer has lesions on her brain per her most recent MRI. She still has the nerve damage that the MS did.  ? ?Her voiding is okay with the Flomax. Urology recommended she self cath daily, she hasn't been doing this.  ? ?Still has issues with constipation, manageable.  ?  ? ?Patient's last menstrual period was 05/03/2017.          ?Sexually active: Yes.    ?The current method of family planning is status post hysterectomy.    ?Exercising: No.  The patient does not participate in regular exercise at present. ?Smoker:  no ? ?Health Maintenance: ?Pap:  06/02/17 NEG  ?History of abnormal Pap:  no ?MMG:    MMG 10/05/18, U/S L-BREAST 10/11/18 ?BMD:   none  ?Colonoscopy: 10/14/20 follow up 10 years  ?TDaP:  09/13/2018 ?Gardasil: n/a  ? ? reports that she has never smoked. She has never used smokeless tobacco. She reports current alcohol use. She reports that she does not use drugs. ? ?Past Medical History:  ?Diagnosis Date  ? MS (multiple sclerosis) ([place])   ? Neurogenic bladder 04/07/2016  ? Neuromuscular disorder ([place])   ? MS  ? Rash   ? patient reports red, flat rash that is now "cyling out"   ? ? ?Past Surgical History:  ?Procedure Laterality Date  ? ABDOMINAL HYSTERECTOMY    ? arm surgery  2014  ? broken left wrist after fall from side of motunain while horse riding;  still has rods in wrist   ? CYSTOSCOPY N/A 06/21/2017  ? Procedure: CYSTOSCOPY;  Surgeon: Salvadore Dom, MD;  Location: [facility];  Service: Gynecology;  Laterality: N/A;  ? [place]  ? reconstructive ACL   ? NOSE SURGERY  2013  ? broken nose after fall from horse   ? TOTAL LAPAROSCOPIC HYSTERECTOMY WITH SALPINGECTOMY Bilateral 06/21/2017  ? Procedure: TOTAL LAPAROSCOPIC HYSTERECTOMY WITH  SALPINGECTOMY;  Surgeon: Salvadore Dom, MD;  Location: [facility];  Service: Gynecology;  Laterality: Bilateral;  ? ? ?Current Outpatient Medications  ?Medication Sig Dispense Refill  ? baclofen (LIORESAL) 10 MG tablet TAKE ONE TABLET BY MOUTH TWO TIMES A DAY AS NEEDED FOR MUSCLE SPASMS. 180 tablet 2  ? diclofenac (VOLTAREN) 75 MG EC tablet Take 1 tablet (75 mg total) by mouth 2 (two) times daily. 60 tablet 0  ? modafinil (PROVIGIL) 100 MG tablet Take 1 tablet (100 mg total) by mouth daily. 90 tablet 1  ? tamsulosin (FLOMAX) 0.4 MG CAPS capsule Take 1 capsule (0.4 mg total) by mouth daily. 90 capsule 3  ? ?No current facility-administered medications for this visit.  ? ? ?Family History  ?Problem Relation Age of Onset  ? Heart attack Mother   ? Breast cancer Sister   ? Osteoporosis Sister   ? Breast cancer Maternal Grandmother   ? ? ?Review of Systems  ?All other systems reviewed and are negative. ? ?Exam:   ?BP 130/72   Pulse 64   Ht 5' 2.25" (1.581 m)   Wt 134 lb (60.8 kg)   LMP 05/03/2017   SpO2 97%   BMI 24.31 kg/m?   Weight change: '@WEIGHTCHANGE'$ @ Height:   Height: 5' 2.25" (158.1 cm)  ?  Ht Readings from Last 3 Encounters:  ?11/10/21 5' 2.25" (1.581 m)  ?02/12/21 '5\' 3"'$  (1.6 m)  ?10/24/20 5' 2.5" (1.588 m)  ? ? ?General appearance: alert, cooperative and appears stated age ?Head: Normocephalic, without obvious abnormality, atraumatic ?Neck: no adenopathy, supple, symmetrical, trachea midline and thyroid  small lump on the left lobe of the thyroid ?Lungs: clear to auscultation bilaterally ?Cardiovascular: regular rate and rhythm ?Breasts: normal appearance, no masses or tenderness ?Abdomen: soft, non-tender; non distended,  no masses,  no organomegaly ?Extremities: extremities normal, atraumatic, no cyanosis or edema ?Skin: Skin color, texture, turgor normal. No rashes or lesions ?Lymph nodes: Cervical, supraclavicular, and axillary nodes normal. ?No abnormal inguinal nodes  palpated ?Neurologic: Grossly normal ? ? ?Pelvic: External genitalia:  no lesions ?             Urethra:  normal appearing urethra with no masses, tenderness or lesions ?             Bartholins and Skenes: normal    ?             Vagina: atrophic appearing vagina with normal color and discharge, no lesions ?             Cervix: absent ?              ?Bimanual Exam:  Uterus:  uterus absent ?             Adnexa: no mass, fullness, tenderness ?              Rectovaginal: Confirms ?              Anus:  normal sphincter tone, no lesions ? Bladder: palpates full ? ?Gae Dry chaperoned for the exam. ? ?1. Well woman exam ?Discussed breast self exam ?Discussed calcium and vit D intake ?Mammogram overdue, # given ?Colonoscopy UTD ? ?2. Urinary retention ?Recommended self cath  ?- Comprehensive metabolic panel ? ?3. Thyroid lump ?- Thyroid Panel With TSH ?- US SOFT TISSUE HEAD & NECK (NON-THYROID); Future ? ?4. Elevated LDL cholesterol level ?- Lipid panel ? ?5. Laboratory exam ordered as part of routine general medical examination ?- CBC with Differential/Platelet ?- Comprehensive metabolic panel ?- Lipid panel ? ? ?

## 2021-11-03 ENCOUNTER — Encounter: Payer: Self-pay | Admitting: Family Medicine

## 2021-11-03 ENCOUNTER — Other Ambulatory Visit: Payer: Self-pay

## 2021-11-03 ENCOUNTER — Other Ambulatory Visit: Payer: Self-pay | Admitting: Family Medicine

## 2021-11-03 ENCOUNTER — Ambulatory Visit: Payer: Managed Care, Other (non HMO) | Admitting: Family Medicine

## 2021-11-03 ENCOUNTER — Ambulatory Visit (INDEPENDENT_AMBULATORY_CARE_PROVIDER_SITE_OTHER): Payer: Managed Care, Other (non HMO)

## 2021-11-03 VITALS — BP 124/72 | HR 66 | Temp 97.6°F | Wt 134.0 lb

## 2021-11-03 DIAGNOSIS — M25551 Pain in right hip: Secondary | ICD-10-CM

## 2021-11-03 DIAGNOSIS — G8929 Other chronic pain: Secondary | ICD-10-CM | POA: Insufficient documentation

## 2021-11-03 MED ORDER — DICLOFENAC SODIUM 75 MG PO TBEC: 75.0000 mg | DELAYED_RELEASE_TABLET | Freq: Two times a day (BID) | ORAL | 0 refills | Status: DC

## 2021-11-03 NOTE — Progress Notes (Signed)
? ?  Subjective:  ? ? Patient ID: Rebecca Mejia, female    DOB: 1966-02-12, 56 y.o.   MRN: [id number] ? ?HPI ?Here for right hip pain. This started about 22 years ago, and it has gotten worse over time. The past few days it has caused the worst pain yet. Says she has full movement of the leg, but putting her weight on it causes a lot of pain. No back pain. No hx of trauma. She has been taking Ibuprofen and applying Voltaren gel and ice with little relief.  ? ? ?Review of Systems  ?Constitutional: Negative.   ?Respiratory: Negative.    ?Cardiovascular: Negative.   ?Musculoskeletal:  Positive for arthralgias.  ? ?   ?Objective:  ? Physical Exam ?Constitutional:   ?   General: She is not in acute distress. ?   Appearance: Normal appearance.  ?   Comments: She walks easily   ?Cardiovascular:  ?   Rate and Rhythm: Normal rate and regular rhythm.  ?   Pulses: Normal pulses.  ?   Heart sounds: Normal heart sounds.  ?Pulmonary:  ?   Effort: Pulmonary effort is normal.  ?   Breath sounds: Normal breath sounds.  ?Musculoskeletal:  ?   Comments: The right hip is normal on exam. No tenderness. Full ROM   ?Neurological:  ?   Mental Status: She is alert.  ? ? ? ? ? ?   ?Assessment & Plan:  ?Right hip pain. She can take Diclofenac 75 mg as needed for pain. We will get Xrays of the hips and pelvis today.  ?Alysia Penna, MD ? ? ?

## 2021-11-05 ENCOUNTER — Telehealth: Payer: Self-pay | Admitting: Family Medicine

## 2021-11-05 NOTE — Telephone Encounter (Signed)
Pt is calling and saw dr fry and would like xray results ?

## 2021-11-05 NOTE — Telephone Encounter (Signed)
Awaiting reply.   Pt had on bilateral hips on 11/03/21 ?

## 2021-11-06 NOTE — Telephone Encounter (Signed)
There is no interpretation of the Xrays in her chart. Please call Radiology to find out why  ?

## 2021-11-09 ENCOUNTER — Encounter: Payer: Self-pay | Admitting: Family Medicine

## 2021-11-10 ENCOUNTER — Encounter: Payer: Self-pay | Admitting: *Deleted

## 2021-11-10 ENCOUNTER — Ambulatory Visit (INDEPENDENT_AMBULATORY_CARE_PROVIDER_SITE_OTHER): Payer: Managed Care, Other (non HMO) | Admitting: Obstetrics and Gynecology

## 2021-11-10 ENCOUNTER — Encounter: Payer: Self-pay | Admitting: Obstetrics and Gynecology

## 2021-11-10 ENCOUNTER — Other Ambulatory Visit: Payer: Self-pay

## 2021-11-10 VITALS — BP 130/72 | HR 64 | Ht 62.25 in | Wt 134.0 lb

## 2021-11-10 DIAGNOSIS — E7800 Pure hypercholesterolemia, unspecified: Secondary | ICD-10-CM

## 2021-11-10 DIAGNOSIS — R339 Retention of urine, unspecified: Secondary | ICD-10-CM | POA: Diagnosis not present

## 2021-11-10 DIAGNOSIS — Z01419 Encounter for gynecological examination (general) (routine) without abnormal findings: Secondary | ICD-10-CM

## 2021-11-10 DIAGNOSIS — E079 Disorder of thyroid, unspecified: Secondary | ICD-10-CM | POA: Diagnosis not present

## 2021-11-10 DIAGNOSIS — Z0000 Encounter for general adult medical examination without abnormal findings: Secondary | ICD-10-CM

## 2021-11-10 NOTE — Patient Instructions (Signed)

## 2021-11-11 LAB — CBC WITH DIFFERENTIAL/PLATELET
Absolute Monocytes: 496 cells/uL (ref 200–950)
Basophils Absolute: 30 cells/uL (ref 0–200)
Basophils Relative: 0.5 %
Eosinophils Absolute: 112 cells/uL (ref 15–500)
Eosinophils Relative: 1.9 %
HCT: 41.3 % (ref 35.0–45.0)
Hemoglobin: 13.5 g/dL (ref 11.7–15.5)
Lymphs Abs: 2366 cells/uL (ref 850–3900)
MCH: 30.3 pg (ref 27.0–33.0)
MCHC: 32.7 g/dL (ref 32.0–36.0)
MCV: 92.8 fL (ref 80.0–100.0)
MPV: 11.3 fL (ref 7.5–12.5)
Monocytes Relative: 8.4 %
Neutro Abs: 2897 cells/uL (ref 1500–7800)
Neutrophils Relative %: 49.1 %
Platelets: 281 10*3/uL (ref 140–400)
RBC: 4.45 10*6/uL (ref 3.80–5.10)
RDW: 11.5 % (ref 11.0–15.0)
Total Lymphocyte: 40.1 %
WBC: 5.9 10*3/uL (ref 3.8–10.8)

## 2021-11-11 LAB — COMPREHENSIVE METABOLIC PANEL
AG Ratio: 1.7 (calc) (ref 1.0–2.5)
ALT: 44 U/L — ABNORMAL HIGH (ref 6–29)
AST: 52 U/L — ABNORMAL HIGH (ref 10–35)
Albumin: 4.8 g/dL (ref 3.6–5.1)
Alkaline phosphatase (APISO): 125 U/L (ref 37–153)
BUN: 15 mg/dL (ref 7–25)
CO2: 28 mmol/L (ref 20–32)
Calcium: 10.1 mg/dL (ref 8.6–10.4)
Chloride: 101 mmol/L (ref 98–110)
Creat: 0.84 mg/dL (ref 0.50–1.03)
Globulin: 2.8 g/dL (calc) (ref 1.9–3.7)
Glucose, Bld: 81 mg/dL (ref 65–99)
Potassium: 4.5 mmol/L (ref 3.5–5.3)
Sodium: 138 mmol/L (ref 135–146)
Total Bilirubin: 0.3 mg/dL (ref 0.2–1.2)
Total Protein: 7.6 g/dL (ref 6.1–8.1)

## 2021-11-11 LAB — LIPID PANEL
Cholesterol: 225 mg/dL — ABNORMAL HIGH (ref <200)
HDL: 73 mg/dL (ref >=50)
LDL Cholesterol (Calc): 132 mg/dL (calc) — ABNORMAL HIGH
Non-HDL Cholesterol (Calc): 152 mg/dL (calc) — ABNORMAL HIGH (ref <130)
Total CHOL/HDL Ratio: 3.1 (calc) (ref <5.0)
Triglycerides: 97 mg/dL (ref <150)

## 2021-11-11 LAB — THYROID PANEL WITH TSH
Free Thyroxine Index: 2 (ref 1.4–3.8)
T3 Uptake: 30 % (ref 22–35)
T4, Total: 6.8 ug/dL (ref 5.1–11.9)
TSH: 2.28 mIU/L (ref 0.40–4.50)

## 2021-11-11 NOTE — Telephone Encounter (Signed)
Pt has been contacted on 11/09/21 regarding results. ?

## 2021-11-13 NOTE — Telephone Encounter (Signed)
She should follow up with her PCP, Dr. Elease Hashimoto, and he can refer her to where he thinks is best  ?

## 2021-11-16 ENCOUNTER — Ambulatory Visit (INDEPENDENT_AMBULATORY_CARE_PROVIDER_SITE_OTHER): Payer: Managed Care, Other (non HMO) | Admitting: Family Medicine

## 2021-11-16 ENCOUNTER — Encounter: Payer: Self-pay | Admitting: Family Medicine

## 2021-11-16 VITALS — BP 130/80 | HR 59 | Temp 97.3°F | Ht 62.23 in | Wt 132.7 lb

## 2021-11-16 DIAGNOSIS — M25551 Pain in right hip: Secondary | ICD-10-CM | POA: Diagnosis not present

## 2021-11-16 NOTE — Patient Instructions (Signed)
I will set up MRI right hip to further assess.   ?

## 2021-11-16 NOTE — Progress Notes (Signed)
? ?Established Patient Office Visit ? ?Subjective:  ?Patient ID: Rebecca Mejia, female    DOB: [birth year]  Age: 56 y.o. MRN: [id number] ? ?CC:  ?Chief Complaint  ?Patient presents with  ? Follow-up  ? ? ?HPI ?Rebecca Mejia presents for right hip pain somewhat progressive for past few weeks.  Denies any recent injury.  She has pain somewhat poorly localized especially anterior and medial hip area.  Was seen here over a week ago.  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  She denies any lower lumbar back pain.  No sacroiliac pain.  No right lateral hip pain.  Pain increased with ambulation.  Denies any lower extremity numbness or weakness.  She took some diclofenac which did not seem to help any. ? ?She has history of MS and currently not on immunosuppressive therapy. ? ?Past Medical History:  ?Diagnosis Date  ? MS (multiple sclerosis) ([place])   ? Neurogenic bladder 04/07/2016  ? Neuromuscular disorder ([place])   ? MS  ? Rash   ? patient reports red, flat rash that is now "cyling out"   ? ? ?Past Surgical History:  ?Procedure Laterality Date  ? ABDOMINAL HYSTERECTOMY    ? arm surgery  2014  ? broken left wrist after fall from side of motunain while horse riding;  still has rods in wrist   ? CYSTOSCOPY N/A 06/21/2017  ? Procedure: CYSTOSCOPY;  Surgeon: Salvadore Dom, MD;  Location: [facility];  Service: Gynecology;  Laterality: N/A;  ? [place]  ? reconstructive ACL   ? NOSE SURGERY  2013  ? broken nose after fall from horse   ? TOTAL LAPAROSCOPIC HYSTERECTOMY WITH SALPINGECTOMY Bilateral 06/21/2017  ? Procedure: TOTAL LAPAROSCOPIC HYSTERECTOMY WITH SALPINGECTOMY;  Surgeon: Salvadore Dom, MD;  Location: [facility];  Service: Gynecology;  Laterality: Bilateral;  ? ? ?Family History  ?Problem Relation Age of Onset  ? Heart attack Mother    ? Breast cancer Sister   ? Osteoporosis Sister   ? Breast cancer Maternal Grandmother   ? ? ?Social History  ? ?Socioeconomic History  ? Marital status: Married  ?  Spouse name: Not on file  ? Number of children: Not on file  ? Years of education: Not on file  ? Highest education level: Not on file  ?Occupational History  ? Not on file  ?Tobacco Use  ? Smoking status: Never  ? Smokeless tobacco: Never  ?Vaping Use  ? Vaping Use: Never used  ?Substance and Sexual Activity  ? Alcohol use: Yes  ?  Comment: 1 a day  ? Drug use: No  ? Sexual activity: Yes  ?  Partners: Male  ?  Birth control/protection: None  ?  Comment: husband vasectomy, hysterectomy  ?Other Topics Concern  ? Not on file  ?Social History Narrative  ? Not on file  ? ?Social Determinants of Health  ? ?Financial Resource Strain: Not on file  ?Food Insecurity: Not on file  ?Transportation Needs: Not on file  ?Physical Activity: Not on file  ?Stress: Not on file  ?Social Connections: Not on file  ?Intimate Partner Violence: Not on file  ? ? ?Outpatient Medications Prior to Visit  ?Medication Sig Dispense Refill  ? baclofen (LIORESAL) 10 MG tablet TAKE ONE TABLET BY MOUTH TWO TIMES A DAY AS NEEDED FOR MUSCLE SPASMS. 180 tablet 2  ?  diclofenac (VOLTAREN) 75 MG EC tablet Take 1 tablet (75 mg total) by mouth 2 (two) times daily. 60 tablet 0  ? modafinil (PROVIGIL) 100 MG tablet Take 1 tablet (100 mg total) by mouth daily. 90 tablet 1  ? tamsulosin (FLOMAX) 0.4 MG CAPS capsule Take 1 capsule (0.4 mg total) by mouth daily. 90 capsule 3  ? ?No facility-administered medications prior to visit.  ? ? ?Allergies  ?Allergen Reactions  ? Codeine Hives and Itching  ? ? ?ROS ?Review of Systems  ?Constitutional:  Negative for chills and fever.  ?Musculoskeletal:  Negative for back pain.  ?Neurological:  Negative for weakness and numbness.  ? ?  ?Objective:  ?  ?Physical Exam ?Vitals reviewed.  ?Constitutional:   ?   Appearance: Normal appearance.  ?Cardiovascular:  ?    Rate and Rhythm: Normal rate and regular rhythm.  ?Musculoskeletal:  ?   Comments: Straight leg raises are negative bilaterally.  She has excellent range of motion right hip.  No lumbar spinal tenderness.  No right lateral hip tenderness.  ?Neurological:  ?   Mental Status: She is alert.  ?   Comments: Full strength lower extremity throughout.  Slightly hyperreflexic knee and ankle bilaterally.  No sensory impairment.  ? ? ?BP 130/80 (BP Location: Left Arm, Patient Position: Sitting, Cuff Size: Normal)   Pulse (!) 59   Temp (!) 97.3 ?F (36.3 ?C) (Oral)   Ht 5' 2.23" (1.581 m)   Wt 132 lb 11.2 oz (60.2 kg)   LMP 05/03/2017   SpO2 99%   BMI 24.10 kg/m?  ?Wt Readings from Last 3 Encounters:  ?11/16/21 132 lb 11.2 oz (60.2 kg)  ?11/10/21 134 lb (60.8 kg)  ?11/03/21 134 lb (60.8 kg)  ? ? ? ?Health Maintenance Due  ?Topic Date Due  ? COVID-19 Vaccine (1) Never done  ? HIV Screening  Never done  ? Hepatitis C Screening  Never done  ? Zoster Vaccines- Shingrix (1 of 2) Never done  ? PAP SMEAR-Modifier  06/02/2020  ? MAMMOGRAM  10/04/2020  ? ? ?There are no preventive care reminders to display for this patient. ? ?Lab Results  ?Component Value Date  ? TSH 2.28 11/10/2021  ? ?Lab Results  ?Component Value Date  ? WBC 5.9 11/10/2021  ? HGB 13.5 11/10/2021  ? HCT 41.3 11/10/2021  ? MCV 92.8 11/10/2021  ? PLT 281 11/10/2021  ? ?Lab Results  ?Component Value Date  ? NA 138 11/10/2021  ? K 4.5 11/10/2021  ? CO2 28 11/10/2021  ? GLUCOSE 81 11/10/2021  ? BUN 15 11/10/2021  ? CREATININE 0.84 11/10/2021  ? BILITOT 0.3 11/10/2021  ? ALKPHOS 109 02/12/2020  ? AST 52 (H) 11/10/2021  ? ALT 44 (H) 11/10/2021  ? PROT 7.6 11/10/2021  ? ALBUMIN 4.7 02/12/2020  ? CALCIUM 10.1 11/10/2021  ? ANIONGAP 8 06/17/2017  ? ?Lab Results  ?Component Value Date  ? CHOL 225 (H) 11/10/2021  ? ?Lab Results  ?Component Value Date  ? HDL 73 11/10/2021  ? ?Lab Results  ?Component Value Date  ? LDLCALC 132 (H) 11/10/2021  ? ?Lab Results  ?Component Value  Date  ? TRIG 97 11/10/2021  ? ?Lab Results  ?Component Value Date  ? CHOLHDL 3.1 11/10/2021  ? ?No results found for: HGBA1C ? ?  ?Assessment & Plan:  ? ?Progressive right hip pain over several weeks.  No injury.  Plain x-ray showed only minimal degenerative change.  Doubt lumbar origin. ?  Labrum tear ? ?-  Consider MRI right hip to further evaluate ? ?No orders of the defined types were placed in this encounter. ? ? ?Follow-up: No follow-ups on file.  ? ? ?Carolann Littler, MD ?

## 2021-11-18 ENCOUNTER — Other Ambulatory Visit: Payer: Self-pay | Admitting: Obstetrics and Gynecology

## 2021-11-18 DIAGNOSIS — Z1231 Encounter for screening mammogram for malignant neoplasm of breast: Secondary | ICD-10-CM

## 2021-11-19 ENCOUNTER — Telehealth: Payer: Self-pay | Admitting: Family Medicine

## 2021-11-19 DIAGNOSIS — M25551 Pain in right hip: Secondary | ICD-10-CM

## 2021-11-19 NOTE — Telephone Encounter (Signed)
Please advise 

## 2021-11-19 NOTE — Telephone Encounter (Signed)
Rebecca Mejia called to see what orders Dr. Elease Hashimoto wanted for this pt. Concerning their hip. She stated that she needed the Dr. to clarify what to take and that she was rescheduling the pt until the Dr. clarify the order.  ? ?Please advise.  ?

## 2021-11-23 NOTE — Telephone Encounter (Signed)
Dr. Elease Hashimoto patient.   I called [facility].   Please change MRI order to stated MRI of Right hip With and Without Contrast.    ?

## 2021-11-24 NOTE — Telephone Encounter (Signed)
Called [facility] spoke to [place], message given that order has been changed. ?

## 2021-11-24 NOTE — Telephone Encounter (Signed)
I changed the order to "with and without" contrast  ?

## 2021-11-26 ENCOUNTER — Ambulatory Visit
Admission: RE | Admit: 2021-11-26 | Discharge: 2021-11-26 | Disposition: A | Discharge status: Home / Self Care | Payer: Managed Care, Other (non HMO) | Source: Ambulatory Visit | Attending: Obstetrics and Gynecology | Admitting: Obstetrics and Gynecology

## 2021-11-26 DIAGNOSIS — Z1231 Encounter for screening mammogram for malignant neoplasm of breast: Secondary | ICD-10-CM

## 2021-11-26 DIAGNOSIS — E079 Disorder of thyroid, unspecified: Secondary | ICD-10-CM

## 2021-11-30 ENCOUNTER — Other Ambulatory Visit: Payer: Self-pay | Admitting: Obstetrics and Gynecology

## 2021-11-30 DIAGNOSIS — R928 Other abnormal and inconclusive findings on diagnostic imaging of breast: Secondary | ICD-10-CM

## 2021-12-15 ENCOUNTER — Ambulatory Visit
Admission: RE | Admit: 2021-12-15 | Discharge: 2021-12-15 | Disposition: A | Discharge status: Home / Self Care | Payer: 59 | Source: Ambulatory Visit | Attending: Obstetrics and Gynecology | Admitting: Obstetrics and Gynecology

## 2021-12-15 ENCOUNTER — Ambulatory Visit
Admission: RE | Admit: 2021-12-15 | Discharge: 2021-12-15 | Disposition: A | Discharge status: Home / Self Care | Payer: Managed Care, Other (non HMO) | Source: Ambulatory Visit | Attending: Obstetrics and Gynecology | Admitting: Obstetrics and Gynecology

## 2021-12-15 DIAGNOSIS — R928 Other abnormal and inconclusive findings on diagnostic imaging of breast: Secondary | ICD-10-CM

## 2022-02-25 ENCOUNTER — Ambulatory Visit: Payer: Managed Care, Other (non HMO) | Admitting: Neurology

## 2022-02-25 ENCOUNTER — Encounter: Payer: Self-pay | Admitting: Neurology

## 2022-02-25 ENCOUNTER — Telehealth: Payer: Self-pay | Admitting: Neurology

## 2022-02-25 VITALS — BP 111/72 | HR 70 | Ht 62.0 in | Wt 136.0 lb

## 2022-02-25 DIAGNOSIS — G35 Multiple sclerosis: Secondary | ICD-10-CM

## 2022-02-25 DIAGNOSIS — N319 Neuromuscular dysfunction of bladder, unspecified: Secondary | ICD-10-CM | POA: Diagnosis not present

## 2022-02-25 MED ORDER — BACLOFEN 10 MG PO TABS
ORAL_TABLET | ORAL | 2 refills | Status: DC
Start: 2022-02-25 — End: 2023-09-14

## 2022-02-25 MED ORDER — MODAFINIL 100 MG PO TABS
100.0000 mg | ORAL_TABLET | Freq: Every day | ORAL | 1 refills | Status: DC
Start: 2022-02-25 — End: 2022-09-07

## 2022-02-25 NOTE — Progress Notes (Addendum)
PATIENT: Rebecca Mejia DOB: 19-Jan-1966  REASON FOR VISIT: follow up for MS HISTORY FROM: patient Primary Neurologist: Willis/Yan  HISTORY OF PRESENT ILLNESS: Today 02/25/22  Rebecca Mejia is here today for follow-up. Stopped her Rebif last year on her own.  Actually feels much better off injection, thrilled to no longer experience injection site soreness, feel depressed.  Flomax has been helpful with  bladder emptying.  Takes baclofen as needed for nocturnal leg spasms. Takes Provigil as needed. Going on trip to [place] with her church in Nov. No new MS symptoms, rarely caths.   Update 02/12/2021 SS: Rebecca Mejia is a 56 year old female with history of MS on rebif.  Receives baclofen, Provigil from this office. Doing well, feels stable. Balance is slightly off at baseline, no falls. Takes baclofen PRN for legs. Takes 1/2 of Provigil as needed. 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 Flomax works well for urinary frequency, urgency, helps her empty her bladder. Does catheterizations periodically. Hasn't been back to see urology. Here today alone. Has no complaints.   Update 02/12/2020 SS: Rebecca Mejia is a 56 year old female with history of multiple sclerosis.  She has neurogenic bladder.  She is on Rebif, Flomax, Provigil, and baclofen.  Her condition remains overall stable.  She does have issues with balance, bladder, muscle spasms, fatigue, and cognitive effects secondary to MS.  The Flomax has been quite helpful, used to have frequent urinary accidents, also UTIs.  She will occasionally do intermittent catheterizations. Takes baclofen PRN for muscle spasm.  She has several horses at her home, has a horse hauling business, she is quite active.  Denies any overall changes in her MS symptoms.  Presents today for evaluation unaccompanied.  HISTORY 01/31/2019 Dr. Jannifer Franklin: Rebecca Mejia is a 56 year old right-handed white female with a history of  multiple sclerosis.  The patient has a neurogenic bladder associated with this.  She recently was placed on Flomax which has been of some benefit to void the bladder.  The patient did have a recent urinary tract infection.  The patient has not had any new numbness or weakness of the face, arms, or legs.  She is on Rebif, she tolerates the medication well.  She denies any significant balance issues.  She denies any vision changes.  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  REVIEW OF SYSTEMS: Out of a complete 14 system review of symptoms, the patient complains only of the following symptoms, and all other reviewed systems are negative.  See HPI  ALLERGIES: Allergies  Allergen Reactions   Codeine Hives and Itching    HOME MEDICATIONS: Outpatient Medications Prior to Visit  Medication Sig Dispense Refill   baclofen (LIORESAL) 10 MG tablet TAKE ONE TABLET BY MOUTH TWO TIMES A DAY AS NEEDED FOR MUSCLE SPASMS. 180 tablet 2   modafinil (PROVIGIL) 100 MG tablet Take 1 tablet (100 mg total) by mouth daily. 90 tablet 1   tamsulosin (FLOMAX) 0.4 MG CAPS capsule Take 1 capsule (0.4 mg total) by mouth daily. 90 capsule 3   diclofenac (VOLTAREN) 75 MG EC tablet Take 1 tablet (75 mg total) by mouth 2 (two) times daily. 60 tablet 0   No facility-administered medications prior to visit.    PAST MEDICAL HISTORY: Past Medical History:  Diagnosis Date   MS (multiple sclerosis) ([place])    Neurogenic bladder 04/07/2016   Neuromuscular disorder ([place])    MS  Rash    patient reports red, flat rash that is now "cyling out"     PAST SURGICAL HISTORY: Past Surgical History:  Procedure Laterality Date   ABDOMINAL HYSTERECTOMY     arm surgery  2014   broken left wrist after fall from side of motunain while horse riding;  still has rods in wrist    CYSTOSCOPY N/A 06/21/2017   Procedure: CYSTOSCOPY;  Surgeon: Salvadore Dom, MD;  Location: [facility];  Service:  Gynecology;  Laterality: N/A;   KNEE SURGERY  1998   reconstructive ACL    NOSE SURGERY  2013   broken nose after fall from horse    TOTAL LAPAROSCOPIC HYSTERECTOMY WITH SALPINGECTOMY Bilateral 06/21/2017   Procedure: TOTAL LAPAROSCOPIC HYSTERECTOMY WITH SALPINGECTOMY;  Surgeon: Salvadore Dom, MD;  Location: [facility];  Service: Gynecology;  Laterality: Bilateral;    FAMILY HISTORY: Family History  Problem Relation Age of Onset   Heart attack Mother    Breast cancer Sister    Osteoporosis Sister    Breast cancer Maternal Grandmother     SOCIAL HISTORY: Social History   Socioeconomic History   Marital status: Married    Spouse name: Not on file   Number of children: Not on file   Years of education: Not on file   Highest education level: Not on file  Occupational History   Not on file  Tobacco Use   Smoking status: Never   Smokeless tobacco: Never  Vaping Use   Vaping Use: Never used  Substance and Sexual Activity   Alcohol use: Yes    Comment: 1 a day   Drug use: No   Sexual activity: Yes    Partners: Male    Birth control/protection: None    Comment: husband vasectomy, hysterectomy  Other Topics Concern   Not on file  Social History Narrative   Not on file   Social Determinants of Health   Financial Resource Strain: Not on file  Food Insecurity: Not on file  Transportation Needs: Not on file  Physical Activity: Not on file  Stress: Not on file  Social Connections: Not on file  Intimate Partner Violence: Not on file   PHYSICAL EXAM  Vitals:   02/25/22 1313  BP: 111/72  Pulse: 70  Weight: 136 lb (61.7 kg)  Height: '5\' 2"'$  (1.575 m)   Body mass index is 24.87 kg/m.  Generalized: Well developed, in no acute distress   Neurological examination  Mentation: Alert oriented to time, place, history taking. Follows all commands speech and language fluent Cranial nerve II-XII: Pupils were equal round reactive to light. Extraocular  movements were full, visual field were full on confrontational test. Facial sensation and strength were normal. Head turning and shoulder shrug  were normal and symmetric. Motor: The motor testing reveals 5 over 5 strength of all 4 extremities. Good symmetric motor tone is noted throughout.  Sensory: Sensory testing is intact to soft touch on all 4 extremities. No evidence of extinction is noted.  Coordination: Cerebellar testing reveals good finger-nose-finger and heel-to-shin bilaterally.  Gait and station: Gait is normal, but slight limp on the left. Tandem gait is unsteady.  Reflexes: Deep tendon reflexes are symmetric but increased at the knees   DIAGNOSTIC DATA (LABS, IMAGING, TESTING) - I reviewed patient records, labs, notes, testing and imaging myself where available.  Lab Results  Component Value Date   WBC 5.9 11/10/2021   HGB 13.5 11/10/2021   HCT 41.3 11/10/2021  MCV 92.8 11/10/2021   PLT 281 11/10/2021      Component Value Date/Time   NA 138 11/10/2021 1124   NA 143 02/12/2020 1347   K 4.5 11/10/2021 1124   CL 101 11/10/2021 1124   CO2 28 11/10/2021 1124   GLUCOSE 81 11/10/2021 1124   BUN 15 11/10/2021 1124   BUN 12 02/12/2020 1347   CREATININE 0.84 11/10/2021 1124   CALCIUM 10.1 11/10/2021 1124   PROT 7.6 11/10/2021 1124   PROT 7.2 02/12/2020 1347   ALBUMIN 4.7 02/12/2020 1347   AST 52 (H) 11/10/2021 1124   ALT 44 (H) 11/10/2021 1124   ALKPHOS 109 02/12/2020 1347   BILITOT 0.3 11/10/2021 1124   BILITOT 0.4 02/12/2020 1347   GFRNONAA 97 02/12/2020 1347   GFRAA 112 02/12/2020 1347   Lab Results  Component Value Date   CHOL 225 (H) 11/10/2021   HDL 73 11/10/2021   LDLCALC 132 (H) 11/10/2021   TRIG 97 11/10/2021   CHOLHDL 3.1 11/10/2021   No results found for: "HGBA1C" No results found for: "VITAMINB12" Lab Results  Component Value Date   TSH 2.28 11/10/2021   ASSESSMENT AND PLAN 56 y.o. year old female  has a past medical history of MS (multiple  sclerosis) ([place]), Neurogenic bladder (04/07/2016), Neuromuscular disorder ([place]), and Rash. here with:  1.  Multiple sclerosis 2.  Neurogenic bladder  -She stopped Rebif last year, no worsening of MS symptoms, will check MRI of the brain to ensure stability -Continue baclofen for leg spasms -Continue Provigil for fatigue  -On Flomax for urinary issues, may benefit from urology consult  -MRI brain was stable in July 2021, Dr. Leonie Man reviewed films from 2018 to 2021, which showed no new lesions -Follow-up in 1 year or sooner if needed   Addendum 04/08/22 SS: Reviewed MRI brain with Dr. Krista Blue, showing scattered white matter areas consistent with MS.  Overall no significant change.  Patient wishes to remain off DMT.  She will closely monitor for any new or worsening symptoms.  Let me know if anything changes.  Keep follow-up appointment in 1 year with Dr. Krista Blue.  Meds ordered this encounter  Medications   baclofen (LIORESAL) 10 MG tablet    Sig: TAKE ONE TABLET BY MOUTH TWO TIMES A DAY AS NEEDED FOR MUSCLE SPASMS.    Dispense:  180 tablet    Refill:  2   modafinil (PROVIGIL) 100 MG tablet    Sig: Take 1 tablet (100 mg total) by mouth daily.    Dispense:  90 tablet    Refill:  1   Butler Denmark, [place], DNP 02/25/2022, 1:45 PM [facility] [address] [place], [place] [address] [phone]

## 2022-02-25 NOTE — Telephone Encounter (Signed)
[employer] sent to [facility] they obtain auth  

## 2022-03-10 ENCOUNTER — Ambulatory Visit
Admission: RE | Admit: 2022-03-10 | Discharge: 2022-03-10 | Disposition: A | Discharge status: Home / Self Care | Payer: Managed Care, Other (non HMO) | Source: Ambulatory Visit | Attending: Neurology | Admitting: Neurology

## 2022-03-10 DIAGNOSIS — G35 Multiple sclerosis: Secondary | ICD-10-CM

## 2022-03-10 MED ORDER — GADOBENATE DIMEGLUMINE 529 MG/ML IV SOLN
12.0000 mL | Freq: Once | INTRAVENOUS | Status: AC | PRN
  Administered 2022-03-10: 12 mL via INTRAVENOUS

## 2022-06-02 ENCOUNTER — Telehealth: Payer: Self-pay | Admitting: Neurology

## 2022-06-02 NOTE — Telephone Encounter (Signed)
I spoke with Rebecca Mejia verbally on this message. She would like PCP to continue refilling. I have sent a my chart message to the pt on this.

## 2022-06-02 NOTE — Telephone Encounter (Signed)
Pt called needing a refill on her tamsulosin (FLOMAX) 0.4 MG CAPS capsule sent to the [facility] on [address]

## 2022-06-04 ENCOUNTER — Other Ambulatory Visit: Payer: Self-pay | Admitting: Family Medicine

## 2022-09-03 ENCOUNTER — Other Ambulatory Visit: Payer: Self-pay | Admitting: Neurology

## 2022-09-03 ENCOUNTER — Other Ambulatory Visit: Payer: Self-pay | Admitting: Family Medicine

## 2022-09-03 ENCOUNTER — Other Ambulatory Visit: Payer: Self-pay

## 2022-09-03 MED ORDER — TAMSULOSIN HCL 0.4 MG PO CAPS: 0.4000 mg | ORAL_CAPSULE | Freq: Every day | ORAL | 0 refills | Status: DC

## 2022-09-09 ENCOUNTER — Telehealth: Payer: Self-pay

## 2022-09-09 MED ORDER — MODAFINIL 100 MG PO TABS
100.0000 mg | ORAL_TABLET | Freq: Every day | ORAL | 1 refills | Status: DC
Start: 2022-09-09 — End: 2022-09-21

## 2022-09-09 NOTE — Telephone Encounter (Signed)
Meds ordered this encounter  Medications   modafinil (PROVIGIL) 100 MG tablet    Sig: Take 1 tablet (100 mg total) by mouth daily.    Dispense:  90 tablet    Refill:  1

## 2022-09-09 NOTE — Telephone Encounter (Signed)
Refill due, the one refill remaining from July of 2023 has expired.

## 2022-09-09 NOTE — Addendum Note (Signed)
Addended by: Suzzanne Cloud on: 09/09/2022 09:07 AM   Modules accepted: Orders

## 2022-09-21 ENCOUNTER — Telehealth: Payer: Self-pay | Admitting: Neurology

## 2022-09-21 MED ORDER — MODAFINIL 100 MG PO TABS: 100.0000 mg | ORAL_TABLET | Freq: Every day | ORAL | 1 refills | Status: DC

## 2022-09-21 NOTE — Addendum Note (Signed)
Addended by: Verlin Grills on: 09/21/2022 11:32 AM   Modules accepted: Orders

## 2022-09-21 NOTE — Telephone Encounter (Signed)
Pt said [employer] no longer uses [facility]. Would need prescription for modafinil (PROVIGIL) 100 MG tablet sent to   [facility], [place] [address] Phone: [phone]

## 2022-09-21 NOTE — Telephone Encounter (Signed)
Refill due

## 2022-09-21 NOTE — Addendum Note (Signed)
Addended by: Suzzanne Cloud on: 09/21/2022 12:14 PM   Modules accepted: Orders

## 2022-11-04 ENCOUNTER — Other Ambulatory Visit: Payer: Self-pay | Admitting: Obstetrics and Gynecology

## 2022-11-04 DIAGNOSIS — Z1231 Encounter for screening mammogram for malignant neoplasm of breast: Secondary | ICD-10-CM

## 2022-11-24 ENCOUNTER — Other Ambulatory Visit: Payer: Self-pay | Admitting: Family Medicine

## 2022-11-24 MED ORDER — TAMSULOSIN HCL 0.4 MG PO CAPS: 0.4000 mg | ORAL_CAPSULE | Freq: Every day | ORAL | 0 refills | Status: DC

## 2022-11-24 NOTE — Telephone Encounter (Signed)
Prescription Request  11/24/2022  LOV: 11/16/21  What is the name of the medication or equipment? tamsulosin (FLOMAX) 0.4 MG CAPS capsule   Have you contacted your pharmacy to request a refill? Yes   Pt informed that MD is OOO until June, but she may need an OV since it has been a year since her last visit.  Pt stated she just needs the Flomax and will call back to schedule an OV.   Which pharmacy would you like this sent to?   [facility] [phone] - [place], [place] - [address] Phone: [phone]  Fax: [phone]        Patient notified that their request is being sent to the clinical staff for review and that they should receive a response within 2 business days.   Please advise at Mobile [phone] (mobile)

## 2022-12-03 MED ORDER — TAMSULOSIN HCL 0.4 MG PO CAPS: 0.4000 mg | ORAL_CAPSULE | Freq: Every day | ORAL | 0 refills | Status: DC

## 2022-12-03 NOTE — Telephone Encounter (Signed)
Pt calling to check on progress of this refill, says she needed it to go to [id number] - [place], [place] - [address] Phone: [phone]  Fax: [phone]

## 2022-12-03 NOTE — Telephone Encounter (Signed)
Rx sent 

## 2022-12-22 ENCOUNTER — Ambulatory Visit: Payer: Managed Care, Other (non HMO)

## 2023-02-02 ENCOUNTER — Ambulatory Visit: Payer: Managed Care, Other (non HMO) | Admitting: Obstetrics and Gynecology

## 2023-02-15 ENCOUNTER — Encounter: Payer: Self-pay | Admitting: Family Medicine

## 2023-02-15 ENCOUNTER — Ambulatory Visit: Payer: Managed Care, Other (non HMO) | Admitting: Family Medicine

## 2023-02-15 VITALS — BP 112/70 | HR 65 | Temp 98.2°F | Ht 62.0 in | Wt 121.5 lb

## 2023-02-15 DIAGNOSIS — Z91030 Bee allergy status: Secondary | ICD-10-CM | POA: Diagnosis not present

## 2023-02-15 MED ORDER — EPINEPHRINE 0.3 MG/0.3ML IJ SOAJ: 0.3000 mg | INTRAMUSCULAR | 1 refills | Status: AC | PRN

## 2023-02-15 NOTE — Progress Notes (Signed)
Established Patient Office Visit  Subjective   Patient ID: EMBYR SIM, female    DOB: 12/11/1965  Age: 57 y.o. MRN: [id number]  Chief Complaint  Patient presents with   Allergic Reaction    X6 days    HPI   Rebecca Mejia is seen following recent bee stings round concern for allergic reaction.  This occurred about 6 days ago.  She was in a barn getting some horse blankets out and recalls having at least 3 stings on her legs and 2 on her back and she thinks these were yellow jackets.  Denies any prior significant adverse reaction to bee sting other than local allergic reaction.  She had some expected swelling around the site of the sting but her concern is that within minutes of the sting she had some facial swelling.  She thinks her lips were slightly swollen but no tongue swelling.  No dyspnea.  She took some antihistamine and eventually the swelling went down.  No hand or foot swelling.  Her edema has fully resolved at this time.  Past Medical History:  Diagnosis Date   MS (multiple sclerosis) ([facility])    Neurogenic bladder 04/07/2016   Neuromuscular disorder ([facility])    MS   Rash    patient reports red, flat rash that is now "cyling out"    Past Surgical History:  Procedure Laterality Date   ABDOMINAL HYSTERECTOMY     arm surgery  2014   broken left wrist after fall from side of motunain while horse riding;  still has rods in wrist    CYSTOSCOPY N/A 06/21/2017   Procedure: CYSTOSCOPY;  Surgeon: Romualdo Bolk, MD;  Location: [facility];  Service: Gynecology;  Laterality: N/A;   KNEE SURGERY  1998   reconstructive ACL    NOSE SURGERY  2013   broken nose after fall from horse    TOTAL LAPAROSCOPIC HYSTERECTOMY WITH SALPINGECTOMY Bilateral 06/21/2017   Procedure: TOTAL LAPAROSCOPIC HYSTERECTOMY WITH SALPINGECTOMY;  Surgeon: Romualdo Bolk, MD;  Location: [facility];  Service: Gynecology;  Laterality: Bilateral;    reports that she has never  smoked. She has never used smokeless tobacco. She reports current alcohol use. She reports that she does not use drugs. family history includes Breast cancer in her maternal grandmother and sister; Heart attack in her mother; Osteoporosis in her sister. Allergies  Allergen Reactions   Codeine Hives and Itching    Review of Systems  Constitutional:  Negative for chills and fever.  Skin:  Negative for itching and rash.      Objective:     BP 112/70 (BP Location: Left Arm, Patient Position: Sitting, Cuff Size: Normal)   Pulse 65   Temp 98.2 F (36.8 C) (Oral)   Ht 5\' 2"  (1.575 m)   Wt 121 lb 8 oz (55.1 kg)   LMP 04/17/2017 (Approximate)   SpO2 100%   BMI 22.22 kg/m  BP Readings from Last 3 Encounters:  02/15/23 112/70  02/25/22 111/72  11/16/21 130/80   Wt Readings from Last 3 Encounters:  02/15/23 121 lb 8 oz (55.1 kg)  02/25/22 136 lb (61.7 kg)  11/16/21 132 lb 11.2 oz (60.2 kg)      Physical Exam Vitals reviewed.  Constitutional:      General: She is not in acute distress.    Appearance: She is not ill-appearing or toxic-appearing.  Cardiovascular:     Rate and Rhythm: Normal rate and regular rhythm.  Heart sounds: No murmur heard. Pulmonary:     Effort: Pulmonary effort is normal.     Breath sounds: Normal breath sounds. No wheezing or rales.  Musculoskeletal:     Right lower leg: No edema.     Left lower leg: No edema.  Skin:    Findings: No rash.  Neurological:     Mental Status: She is alert.      No results found for any visits on 02/15/23.    The 10-year ASCVD risk score (Arnett DK, et al., 2019) is: 2.1%    Assessment & Plan:   Recent bee sting reportedly with yellow jackets.  She had about 5 stings total that she was aware of but concerning is the fact that she had facial swelling and possibly some early angioedema type reaction.  She denied any significant dyspnea.  Her symptoms have fully resolved at this time.  -Given the fact she  had facial swelling with stings involving her lower extremities and trunk we have strongly advised EpiPen to have with her at all times -Also advised carrying antihistamine such as Benadryl and if she has any future stings to seek medical care immediately if she has any additional swelling with certainly to keep EpiPen with her at all times -We also offered referral to allergist at some point if she would like to discuss further with them -Handout on bee sting allergy given  Evelena Peat, MD

## 2023-02-16 ENCOUNTER — Ambulatory Visit
Admission: RE | Admit: 2023-02-16 | Discharge: 2023-02-16 | Disposition: A | Discharge status: Home / Self Care | Payer: Managed Care, Other (non HMO) | Source: Ambulatory Visit | Attending: Obstetrics and Gynecology

## 2023-02-16 DIAGNOSIS — Z1231 Encounter for screening mammogram for malignant neoplasm of breast: Secondary | ICD-10-CM

## 2023-02-22 ENCOUNTER — Other Ambulatory Visit: Payer: Self-pay | Admitting: Family

## 2023-03-01 ENCOUNTER — Ambulatory Visit: Payer: Managed Care, Other (non HMO) | Admitting: Neurology

## 2023-03-08 ENCOUNTER — Other Ambulatory Visit: Payer: Self-pay

## 2023-03-08 MED ORDER — TAMSULOSIN HCL 0.4 MG PO CAPS: 0.4000 mg | ORAL_CAPSULE | Freq: Every day | ORAL | 0 refills | Status: DC

## 2023-03-28 ENCOUNTER — Ambulatory Visit: Payer: Managed Care, Other (non HMO) | Admitting: Neurology

## 2023-03-29 IMAGING — DX DG HIP (WITH OR WITHOUT PELVIS) 3-4V BILAT
5 series · 5 of 5 positions shown · non-contrast
Comparison: None.

CLINICAL DATA: Right hip pain.

EXAM:
DG HIP (WITH OR WITHOUT PELVIS) 3-4V BILAT

[pelvis ap]
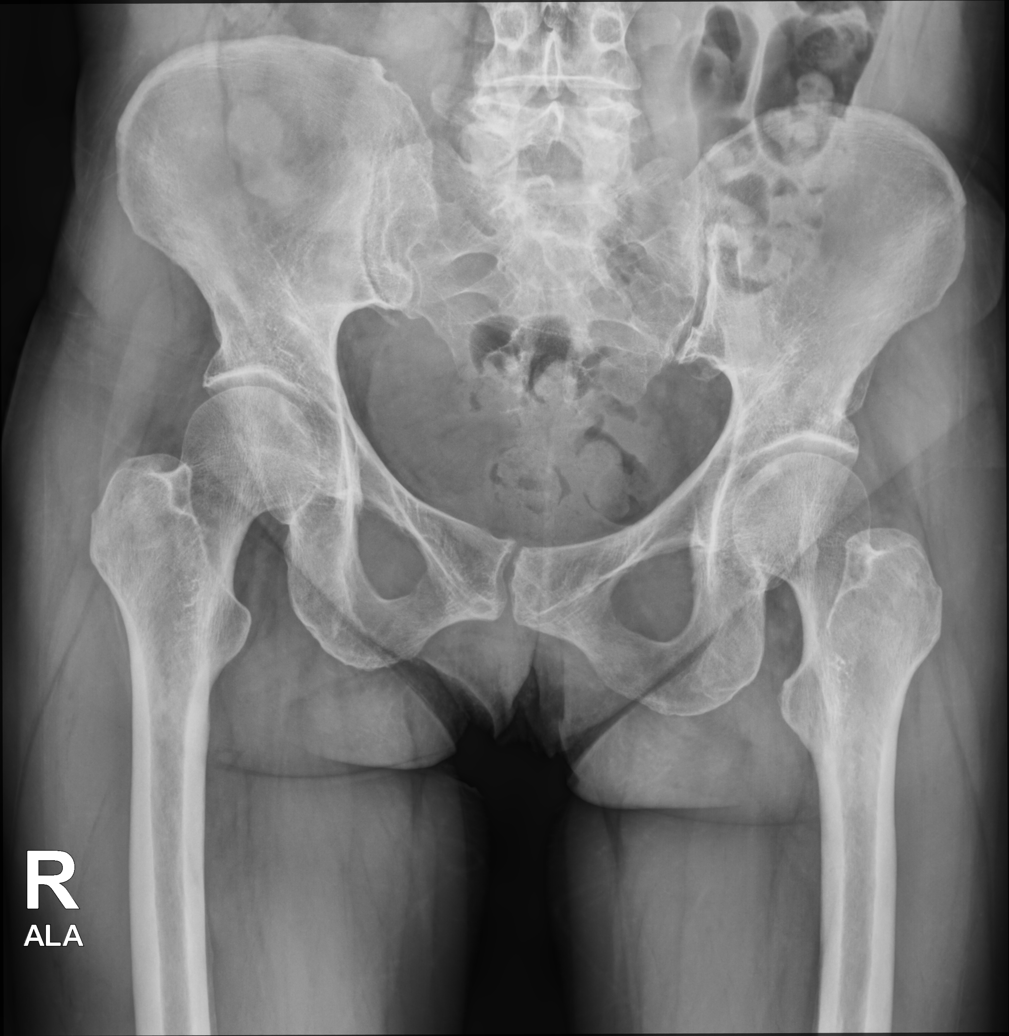

[hip joint ap right]
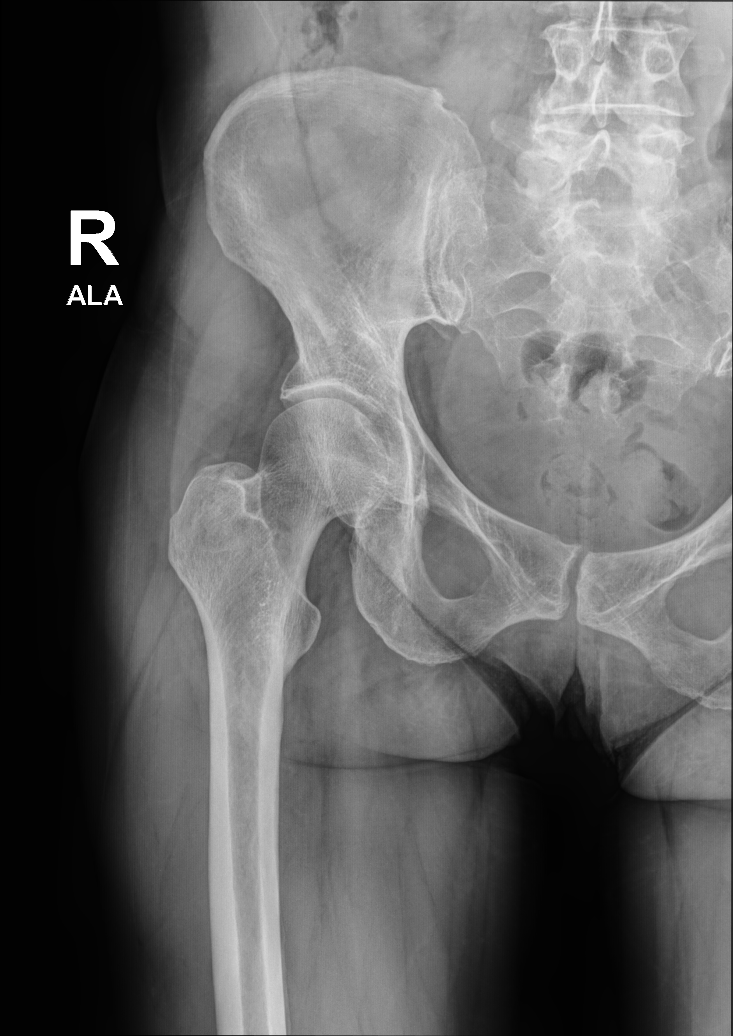

[hip joint ap left]
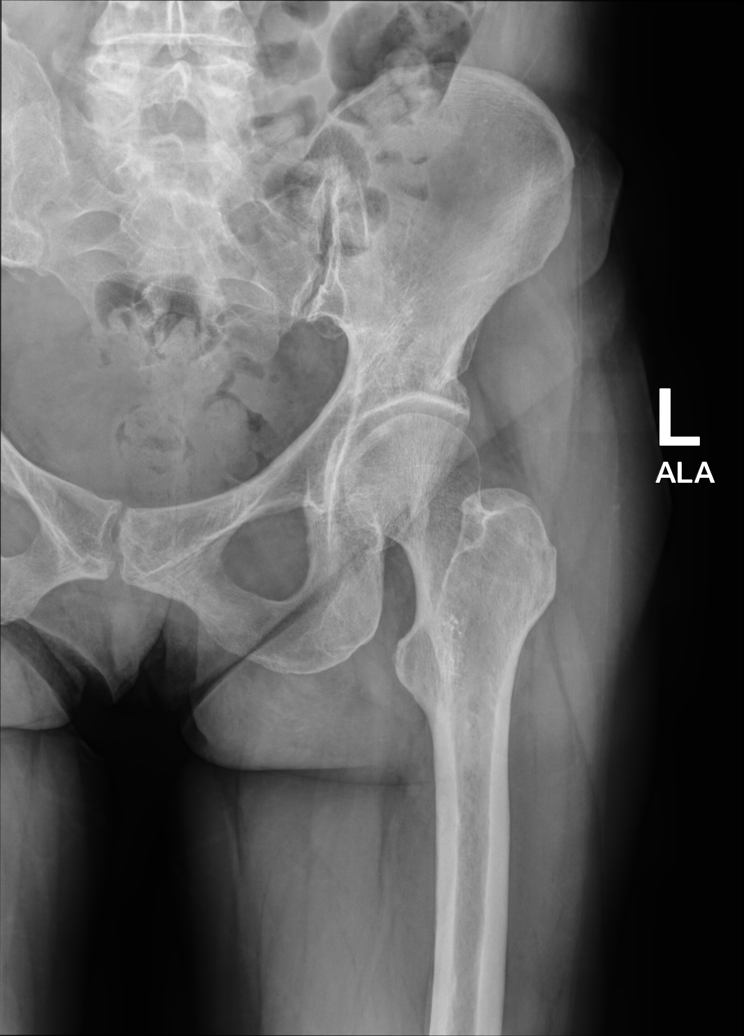

[hip (frog leg) lat left]
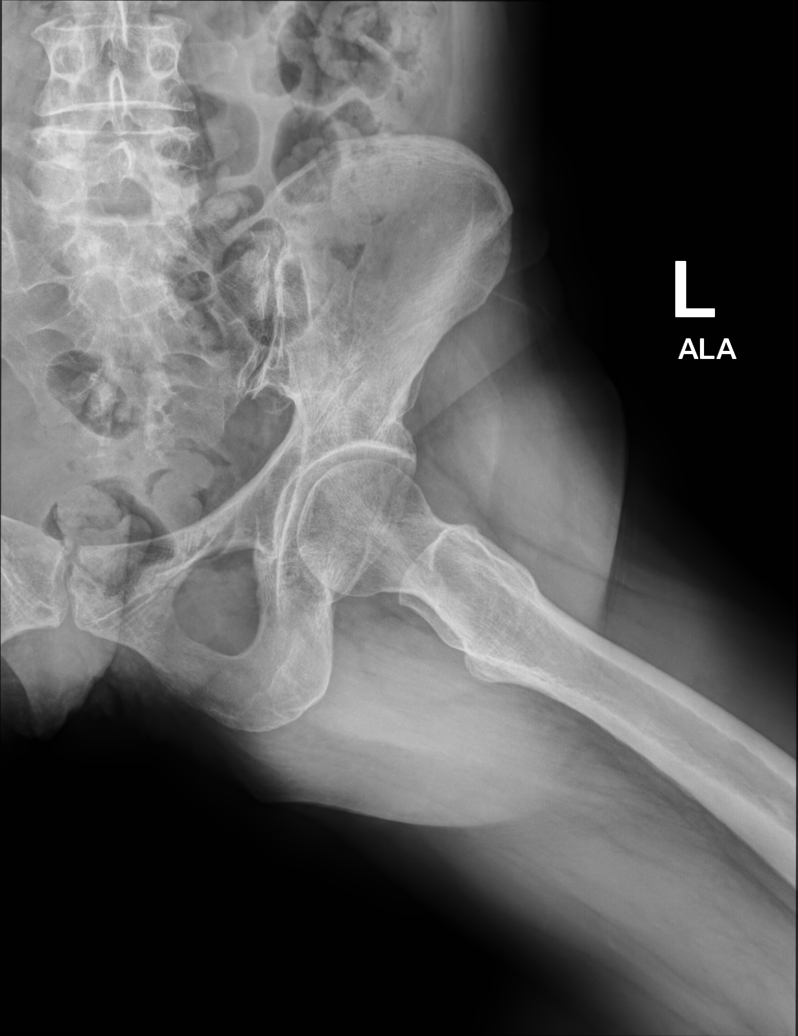

[hip (frog leg) lat right]
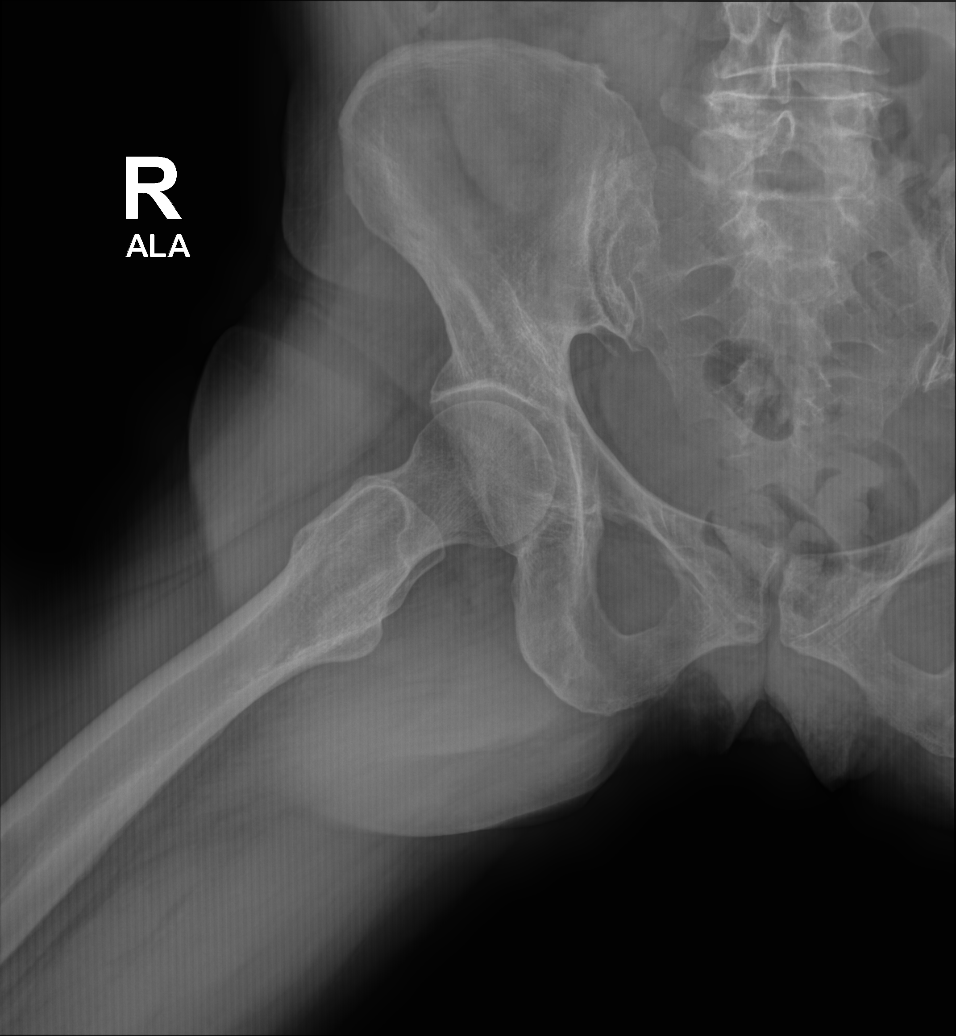

[5 of 5 positions shown; findings below may reference images not displayed]

FINDINGS: Mild pubic symphysis joint space narrowing and superior spurring.
Mild bilateral sacroiliac subchondral sclerosis degenerative change.
The bilateral femoroacetabular joint spaces are maintained. No acute
fracture or dislocation.
IMPRESSION: Minimal pubic symphysis osteoarthritis.

No significant osteoarthritis of either femoroacetabular joint.

## 2023-05-10 ENCOUNTER — Encounter: Payer: Self-pay | Admitting: Neurology

## 2023-05-10 ENCOUNTER — Ambulatory Visit: Payer: Managed Care, Other (non HMO) | Admitting: Neurology

## 2023-05-10 ENCOUNTER — Telehealth: Payer: Self-pay | Admitting: Neurology

## 2023-05-10 VITALS — BP 110/68 | Ht 63.0 in | Wt 127.0 lb

## 2023-05-10 DIAGNOSIS — R5383 Other fatigue: Secondary | ICD-10-CM | POA: Diagnosis not present

## 2023-05-10 DIAGNOSIS — G35 Multiple sclerosis: Secondary | ICD-10-CM

## 2023-05-10 DIAGNOSIS — N319 Neuromuscular dysfunction of bladder, unspecified: Secondary | ICD-10-CM

## 2023-05-10 MED ORDER — MODAFINIL 100 MG PO TABS: 100.0000 mg | ORAL_TABLET | Freq: Every day | ORAL | 3 refills | Status: DC

## 2023-05-10 NOTE — Telephone Encounter (Signed)
sent to [facility] they obtain [place] [phone]

## 2023-05-10 NOTE — Progress Notes (Signed)
Chief Complaint  Patient presents with   Follow-up    Rm 15, alone, f/u MS neurogenic bladder, no new synptoms      ASSESSMENT AND PLAN  TIJA GUILLIAMS is a 57 y.o. female   Relapsing remitting multiple sclerosis Neurogenic bladder Fatigue  I have discussed with her options of following up with her symptoms alone, versus with imaging study, she decided to proceed with imaging study, her previous history suggestive of the spinal cord involvement,  Repeat MRI of the brain and spine with without contrast  Laboratory evaluations  Refill modafinil  Continue hold off long-term immunomodulation therapy, hope her disease remain dormant, she knows to clinic for any possible flareup  DIAGNOSTIC DATA (LABS, IMAGING, TESTING) - I reviewed patient records, labs, notes, testing and imaging myself where available.   MEDICAL HISTORY:  Rebecca Mejia, is a 57 year old female follow-up for relapsing remitting multiple sclerosis, patient of Dr. Anne Hahn in the past, her primary care physician is Dr. Caryl Never, [place]  She was diagnosed with relapsing remitting multiple sclerosis in 2011 at [facility], presenting with sudden onset blurry vision, dizziness, unsteady gait, diagnosis was confirmed by abnormal MRI of brain, spinal tap, visual evoked potential, was treated with Rebif since diagnosis  In the next following couple years, she had a few flareup, gait abnormality, incontinence, or body spasm, lasting for few weeks, symptoms would improve  But she has significant side effect with Rebif, worsening depression, injection site reaction, flulike illness  Because her symptoms was overall stable clinical wise and imaging wise, eventually she self stopped the Rebif in 2021, there is no flareup of her symptoms since, her neurogenic bladder much improved with Flomax treatment, taking modafinil as needed for fatigue, she has her own business of transporting courses, drive long distances, she  denies difficulty walking, no visual trouble  She overall feels better after stopping Rebif  Reviewed and compared to multiple MRI of the brain with without contrast, most recent March 11, 2022, scattered periventricular, subcortical, brainstem, corpus callosum FLAIR lesions consistent with chronic demyelinating plaque, no contrast-enhancement, no significant change compared to previous imaging studies.   PHYSICAL EXAM:   Vitals:   05/10/23 1313  BP: 110/68  Weight: 127 lb (57.6 kg)  Height: 5\' 3"  (1.6 m)    Body mass index is 22.5 kg/m.  PHYSICAL EXAMNIATION:  Gen: NAD, conversant, well nourised, well groomed                     Cardiovascular: Regular rate rhythm, no peripheral edema, warm, nontender. Eyes: Conjunctivae clear without exudates or hemorrhage Neck: Supple, no carotid bruits. Pulmonary: Clear to auscultation bilaterally   NEUROLOGICAL EXAM:  MENTAL STATUS: Speech/cognition: Awake, alert, oriented to history taking and casual conversation CRANIAL NERVES: CN II: Visual fields are full to confrontation. Pupils are round equal and briskly reactive to light. CN III, IV, VI: extraocular movement are normal. No ptosis. CN V: Facial sensation is intact to light touch CN VII: Face is symmetric with normal eye closure  CN VIII: Hearing is normal to causal conversation. CN IX, X: Phonation is normal. CN XI: Head turning and shoulder shrug are intact  MOTOR: There is no pronator drift of out-stretched arms. Muscle bulk and tone are normal. Muscle strength is normal.  REFLEXES: Reflexes are 2+ and symmetric at the biceps, triceps, 3/3knees, and ankles. Plantar responses are flexor.  SENSORY: Intact to light touch, pinprick and vibratory sensation are intact in fingers and toes.  COORDINATION: There is no trunk or limb dysmetria noted.  GAIT/STANCE: Posture is normal. Gait is steady with normal steps, base, arm swing, and turning. Heel and toe walking are normal,  mild difficulty with tandem walking Romberg is absent.  REVIEW OF SYSTEMS:  Full 14 system review of systems performed and notable only for as above All other review of systems were negative.   ALLERGIES: Allergies  Allergen Reactions   Codeine Hives and Itching    HOME MEDICATIONS: Current Outpatient Medications  Medication Sig Dispense Refill   baclofen (LIORESAL) 10 MG tablet TAKE ONE TABLET BY MOUTH TWO TIMES A DAY AS NEEDED FOR MUSCLE SPASMS. 180 tablet 2   EPINEPHrine (EPIPEN 2-PAK) 0.3 mg/0.3 mL IJ SOAJ injection Inject 0.3 mg into the muscle as needed for anaphylaxis. 1 each 1   modafinil (PROVIGIL) 100 MG tablet Take 1 tablet (100 mg total) by mouth daily. 90 tablet 1   tamsulosin (FLOMAX) 0.4 MG CAPS capsule Take 1 capsule (0.4 mg total) by mouth daily. 90 capsule 0   No current facility-administered medications for this visit.    PAST MEDICAL HISTORY: Past Medical History:  Diagnosis Date   MS (multiple sclerosis) ([facility])    Neurogenic bladder 04/07/2016   Neuromuscular disorder ([facility])    MS   Rash    patient reports red, flat rash that is now "cyling out"     PAST SURGICAL HISTORY: Past Surgical History:  Procedure Laterality Date   ABDOMINAL HYSTERECTOMY     arm surgery  2014   broken left wrist after fall from side of motunain while horse riding;  still has rods in wrist    CYSTOSCOPY N/A 06/21/2017   Procedure: CYSTOSCOPY;  Surgeon: Romualdo Bolk, MD;  Location: [facility];  Service: Gynecology;  Laterality: N/A;   KNEE SURGERY  1998   reconstructive ACL    NOSE SURGERY  2013   broken nose after fall from horse    TOTAL LAPAROSCOPIC HYSTERECTOMY WITH SALPINGECTOMY Bilateral 06/21/2017   Procedure: TOTAL LAPAROSCOPIC HYSTERECTOMY WITH SALPINGECTOMY;  Surgeon: Romualdo Bolk, MD;  Location: [facility];  Service: Gynecology;  Laterality: Bilateral;    FAMILY HISTORY: Family History  Problem Relation Age of Onset    Heart attack Mother    Breast cancer Sister    Osteoporosis Sister    Breast cancer Maternal Grandmother     SOCIAL HISTORY: Social History   Socioeconomic History   Marital status: Married    Spouse name: Not on file   Number of children: Not on file   Years of education: Not on file   Highest education level: Not on file  Occupational History   Not on file  Tobacco Use   Smoking status: Never   Smokeless tobacco: Never  Vaping Use   Vaping status: Never Used  Substance and Sexual Activity   Alcohol use: Yes    Comment: 1 a day   Drug use: No   Sexual activity: Yes    Partners: Male    Birth control/protection: None    Comment: husband vasectomy, hysterectomy  Other Topics Concern   Not on file  Social History Narrative   Right  handed   Caffeine-3 cups dialy   Lives with spouse   Social Determinants of Health   Financial Resource Strain: Not on file  Food Insecurity: Not on file  Transportation Needs: Not on file  Physical Activity: Not on file  Stress: Not on file  Social Connections:  Not on file  Intimate Partner Violence: Not on file      Levert Feinstein, M.D. Ph.D.  [facility] [address], [address] [place], [place] [address] Ph: [phone] Fax: [phone]  CC:  Kristian Covey, MD [address] [place],  [place] [address]  Kristian Covey, MD

## 2023-05-11 ENCOUNTER — Other Ambulatory Visit: Payer: Self-pay | Admitting: Family Medicine

## 2023-05-11 LAB — CBC WITH DIFFERENTIAL/PLATELET
Basophils Absolute: 0 10*3/uL (ref 0.0–0.2)
Basos: 0 %
EOS (ABSOLUTE): 0.1 10*3/uL (ref 0.0–0.4)
Eos: 2 %
Hematocrit: 37.4 % (ref 34.0–46.6)
Hemoglobin: 12.4 g/dL (ref 11.1–15.9)
Immature Grans (Abs): 0 10*3/uL (ref 0.0–0.1)
Immature Granulocytes: 0 %
Lymphocytes Absolute: 1.7 10*3/uL (ref 0.7–3.1)
Lymphs: 28 %
MCH: 31.2 pg (ref 26.6–33.0)
MCHC: 33.2 g/dL (ref 31.5–35.7)
MCV: 94 fL (ref 79–97)
Monocytes Absolute: 0.6 10*3/uL (ref 0.1–0.9)
Monocytes: 9 %
Neutrophils Absolute: 3.8 10*3/uL (ref 1.4–7.0)
Neutrophils: 61 %
Platelets: 249 10*3/uL (ref 150–450)
RBC: 3.98 x10E6/uL (ref 3.77–5.28)
RDW: 11.8 % (ref 11.7–15.4)
WBC: 6.2 10*3/uL (ref 3.4–10.8)

## 2023-05-11 LAB — COMPREHENSIVE METABOLIC PANEL
ALT: 26 IU/L (ref 0–32)
AST: 31 IU/L (ref 0–40)
Albumin: 4.7 g/dL (ref 3.8–4.9)
Alkaline Phosphatase: 77 IU/L (ref 44–121)
BUN/Creatinine Ratio: 15 (ref 9–23)
BUN: 10 mg/dL (ref 6–24)
Bilirubin Total: 0.2 mg/dL (ref 0.0–1.2)
CO2: 25 mmol/L (ref 20–29)
Calcium: 9.6 mg/dL (ref 8.7–10.2)
Chloride: 101 mmol/L (ref 96–106)
Creatinine, Ser: 0.65 mg/dL (ref 0.57–1.00)
Globulin, Total: 2 g/dL (ref 1.5–4.5)
Glucose: 95 mg/dL (ref 70–99)
Potassium: 4.3 mmol/L (ref 3.5–5.2)
Sodium: 141 mmol/L (ref 134–144)
Total Protein: 6.7 g/dL (ref 6.0–8.5)
eGFR: 101 mL/min/{1.73_m2} (ref >59)

## 2023-05-11 LAB — TSH: TSH: 1.35 u[IU]/mL (ref 0.450–4.500)

## 2023-05-11 LAB — VITAMIN D 25 HYDROXY (VIT D DEFICIENCY, FRACTURES): Vit D, 25-Hydroxy: 57.3 ng/mL (ref 30.0–100.0)

## 2023-05-24 ENCOUNTER — Other Ambulatory Visit: Payer: Managed Care, Other (non HMO)

## 2023-06-07 ENCOUNTER — Ambulatory Visit: Payer: Managed Care, Other (non HMO) | Admitting: Neurology

## 2023-06-12 ENCOUNTER — Other Ambulatory Visit: Payer: Self-pay | Admitting: Family Medicine

## 2023-06-22 ENCOUNTER — Ambulatory Visit
Admission: RE | Admit: 2023-06-22 | Discharge: 2023-06-22 | Disposition: A | Discharge status: Home / Self Care | Payer: Managed Care, Other (non HMO) | Source: Ambulatory Visit | Attending: Neurology | Admitting: Neurology

## 2023-06-22 ENCOUNTER — Other Ambulatory Visit: Payer: Self-pay | Admitting: Neurology

## 2023-06-22 DIAGNOSIS — G35 Multiple sclerosis: Secondary | ICD-10-CM

## 2023-06-22 DIAGNOSIS — N319 Neuromuscular dysfunction of bladder, unspecified: Secondary | ICD-10-CM

## 2023-06-22 DIAGNOSIS — R5383 Other fatigue: Secondary | ICD-10-CM

## 2023-07-13 ENCOUNTER — Other Ambulatory Visit: Payer: Self-pay | Admitting: Family Medicine

## 2023-09-10 ENCOUNTER — Other Ambulatory Visit: Payer: Self-pay | Admitting: Family Medicine

## 2023-09-14 ENCOUNTER — Telehealth: Payer: Self-pay | Admitting: Neurology

## 2023-09-14 MED ORDER — BACLOFEN 10 MG PO TABS: 10.0000 mg | ORAL_TABLET | Freq: Two times a day (BID) | ORAL | 3 refills | Status: AC

## 2023-09-14 MED ORDER — BACLOFEN 10 MG PO TABS: 10.0000 mg | ORAL_TABLET | Freq: Two times a day (BID) | ORAL | 3 refills | Status: DC

## 2023-09-14 NOTE — Telephone Encounter (Signed)
LVM stating medication was sent in today. Called pharmacy to verify receipt, on hold for 20 or more minutes. Advised patient to follow up with pharmacy and reach out if needed

## 2023-09-14 NOTE — Telephone Encounter (Signed)
Pt called stating that her baclofen (LIORESAL) 10 MG tablet is not going through for a refill request at the [facility] in [place] and she is wanting to know why. Please advise.

## 2023-09-14 NOTE — Telephone Encounter (Signed)
  Meds ordered this encounter     baclofen (LIORESAL) 10 MG tablet    Sig: Take 1 tablet (10 mg total) by mouth 2 (two) times daily. TAKE ONE TABLET BY MOUTH TWO TIMES A DAY AS NEEDED FOR MUSCLE SPASMS.    Dispense:  180 tablet    Refill:  3

## 2023-09-15 ENCOUNTER — Encounter: Payer: Self-pay | Admitting: Neurology

## 2023-11-08 ENCOUNTER — Other Ambulatory Visit: Payer: Self-pay | Admitting: Family Medicine

## 2023-11-16 ENCOUNTER — Other Ambulatory Visit: Payer: Self-pay | Admitting: Neurology

## 2023-11-17 NOTE — Telephone Encounter (Signed)
 Requested Prescriptions   Pending Prescriptions Disp Refills   modafinil (PROVIGIL) 100 MG tablet [Pharmacy Med Name: MODAFINIL 100MG  TABLETS] 90 tablet     Sig: TAKE 1 TABLET(100 MG) BY MOUTH DAILY   Last seen 05/10/23, next appt not scheduled Dispenses   Dispensed Days Supply Quantity Provider Pharmacy  MODAFINIL 100MG  TABLETS 11/08/2023 5 5 each Levert Feinstein, MD [facility] DRUG STORE #...  MODAFINIL 100MG  TABLETS 10/11/2023 30 25 each Levert Feinstein, MD [facility] DRUG STORE #...  MODAFINIL 100MG  TABLETS 10/09/2023 5 5 each Levert Feinstein, MD [facility] DRUG STORE #...  MODAFINIL 100MG  TABLETS 09/09/2023 30 25 each Levert Feinstein, MD [facility] DRUG STORE #...  MODAFINIL 100MG  TABLETS 09/08/2023 5 5 each Levert Feinstein, MD [facility] DRUG STORE #...  MODAFINIL 100MG  TABLETS 08/09/2023 30 30 each Levert Feinstein, MD [facility] DRUG STORE #...  MODAFINIL 100MG  TABLETS 05/10/2023 30 30 each Levert Feinstein, MD [facility] DRUG STORE #.Marland KitchenMarland Kitchen

## 2023-11-25 ENCOUNTER — Other Ambulatory Visit: Payer: Self-pay | Admitting: Family Medicine

## 2023-11-30 NOTE — Telephone Encounter (Signed)
 RN called pt. Pt did not answer, RN LVM with a callback number. Per chart review, it appears pt needs an appt.   Copied from CRM [id number]. Topic: Clinical - Medication Question >> Nov 30, 2023  8:38 AM Rebecca Mejia wrote: Reason for CRM: Patient states she got a text message from the pharmacy, she doesn't remember what the pharmacist advised her but she wants to make sure there's no delay on our end in regards of her prescription for tamsulosin (FLOMAX) 0.4 MG CAPS capsule. Advise patient she needs to get more info from pharmacy for better assist.

## 2023-12-12 ENCOUNTER — Other Ambulatory Visit: Payer: Self-pay | Admitting: Family Medicine

## 2023-12-14 ENCOUNTER — Encounter: Payer: Self-pay | Admitting: Family Medicine

## 2023-12-14 ENCOUNTER — Ambulatory Visit: Admitting: Family Medicine

## 2023-12-14 VITALS — BP 130/80 | HR 66 | Temp 98.1°F | Wt 124.0 lb

## 2023-12-14 DIAGNOSIS — N319 Neuromuscular dysfunction of bladder, unspecified: Secondary | ICD-10-CM

## 2023-12-14 DIAGNOSIS — G35 Multiple sclerosis: Secondary | ICD-10-CM | POA: Diagnosis not present

## 2023-12-14 MED ORDER — TAMSULOSIN HCL 0.4 MG PO CAPS: 0.4000 mg | ORAL_CAPSULE | Freq: Every day | ORAL | 3 refills | Status: AC

## 2023-12-14 NOTE — Progress Notes (Signed)
 Established Patient Office Visit  Subjective   Patient ID: Rebecca Mejia, female    DOB: [birth year]  Age: 58 y.o. MRN: [id number]  No chief complaint on file.   HPI   Rebecca Mejia, is here basically requesting refill of tamsulosin .  She has history of MS but sounds like this is in remission.  Most recent MRI brain was November 2024 which showed no significant change compared with prior MRI 03/10/2022.  She is doing very well overall.  She does have history of neurogenic bladder.  She started tamsulosin  years ago and at 1 point in the past year decided to try to come off but had some difficulties urinating.  This has consistently helped her urination symptoms.  He continues to be followed by neurology.  Past Medical History:  Diagnosis Date   MS (multiple sclerosis) (HCC)    Neurogenic bladder 04/07/2016   Neuromuscular disorder (HCC)    MS   Rash    patient reports red, flat rash that is now "cyling out"    Past Surgical History:  Procedure Laterality Date   ABDOMINAL HYSTERECTOMY     arm surgery  2014   broken left wrist after fall from side of [place] while horse riding;  still has rods in wrist    CYSTOSCOPY N/A 06/21/2017   Procedure: CYSTOSCOPY;  Surgeon: Wanita Gutta, MD;  Location: [facility];  Service: Gynecology;  Laterality: N/A;   KNEE SURGERY  1998   reconstructive ACL    NOSE SURGERY  2013   broken nose after fall from horse    TOTAL LAPAROSCOPIC HYSTERECTOMY WITH SALPINGECTOMY Bilateral 06/21/2017   Procedure: TOTAL LAPAROSCOPIC HYSTERECTOMY WITH SALPINGECTOMY;  Surgeon: Wanita Gutta, MD;  Location: [facility];  Service: Gynecology;  Laterality: Bilateral;    reports that she has never smoked. She has never used smokeless tobacco. She reports current alcohol use. She reports that she does not use drugs. family history includes Breast cancer in her maternal grandmother and sister; Heart attack in her mother; Osteoporosis  in her sister. Allergies  Allergen Reactions   Codeine Hives and Itching    Review of Systems  Constitutional:  Negative for chills and fever.  Genitourinary:  Negative for dysuria, flank pain and hematuria.      Objective:     BP 130/80 (BP Location: Left Arm, Patient Position: Sitting, Cuff Size: Normal)   Pulse 66   Temp 98.1 F (36.7 C) (Oral)   Wt 124 lb (56.2 kg)   LMP 04/17/2017 (Approximate)   SpO2 95%   BMI 21.97 kg/m  BP Readings from Last 3 Encounters:  12/14/23 130/80  05/10/23 110/68  02/15/23 112/70   Wt Readings from Last 3 Encounters:  12/14/23 124 lb (56.2 kg)  05/10/23 127 lb (57.6 kg)  02/15/23 121 lb 8 oz (55.1 kg)      Physical Exam Vitals reviewed.  Constitutional:      General: She is not in acute distress.    Appearance: She is not ill-appearing.  Cardiovascular:     Rate and Rhythm: Normal rate and regular rhythm.  Pulmonary:     Effort: Pulmonary effort is normal.     Breath sounds: Normal breath sounds. No wheezing or rales.  Neurological:     Mental Status: She is alert.      No results found for any visits on 12/14/23.    The 10-year ASCVD risk score (Arnett DK, et al., 2019) is: 3.1%  Assessment & Plan:   Problem List Items Addressed This Visit       Unprioritized   Neurogenic bladder   Multiple sclerosis (HCC) - Primary  Rebecca Mejia has history of MS which sounds like is in remission.  She has had a history of neurogenic bladder.  Continues to benefit from tamsulosin .  Refill provided for 1 year.  We did suggest she consider complete physical at some point this year but she is undecided  No follow-ups on file.    Glean Lamy, MD

## 2024-01-23 ENCOUNTER — Telehealth: Payer: Self-pay | Admitting: Pharmacist

## 2024-01-23 NOTE — Telephone Encounter (Signed)
 Pharmacy Patient Advocate Encounter  Received notification from [employer] that Prior Authorization for Modafinil  100MG  tablets has been APPROVED from 01/23/2024 to 01/22/2025   PA #/Case ID/Reference #: [id number]

## 2024-01-23 NOTE — Telephone Encounter (Signed)
 Pharmacy Patient Advocate Encounter   Received notification from Patient Pharmacy that prior authorization for Modafinil  100MG  tablets is required/requested.   Insurance verification completed.   The patient is insured through [employer] .   Per test claim: PA required; PA submitted to above mentioned insurance via CoverMyMeds Key/confirmation #/EOC [facility] Status is pending

## 2024-02-27 ENCOUNTER — Ambulatory Visit: Admitting: Family Medicine

## 2024-02-27 VITALS — BP 110/70 | HR 64 | Temp 98.7°F | Wt 125.2 lb

## 2024-02-27 DIAGNOSIS — R300 Dysuria: Secondary | ICD-10-CM

## 2024-02-27 LAB — POC URINALSYSI DIPSTICK (AUTOMATED)
Bilirubin, UA: NEGATIVE
Glucose, UA: NEGATIVE
Ketones, UA: NEGATIVE
Nitrite, UA: POSITIVE
Protein, UA: POSITIVE — AB
Spec Grav, UA: 1.01 (ref 1.010–1.025)
Urobilinogen, UA: 0.2 U/dL
pH, UA: 6 (ref 5.0–8.0)

## 2024-02-27 MED ORDER — NITROFURANTOIN MONOHYD MACRO 100 MG PO CAPS: 100.0000 mg | ORAL_CAPSULE | Freq: Two times a day (BID) | ORAL | 0 refills | Status: AC

## 2024-02-27 NOTE — Progress Notes (Signed)
 Established Patient Office Visit  Subjective   Patient ID: Rebecca Mejia, female    DOB: [birth year]  Age: 58 y.o. MRN: [id number]  Chief Complaint  Patient presents with   Dysuria   Urinary Tract Infection    HPI   [name] is seen with approxi-1 week history of some cloudy urine and increased odor along with occasional mild burning.  She had recent mission trip to [place] and then came back and did a camping trip.  She feels like she may not of hydrated well during her travels.  Does have history of MS and has had issues with neurogenic bladder in the past but no history of frequent UTIs.  No fever.  No nausea or vomiting.  No flank pain.  Past Medical History:  Diagnosis Date   MS (multiple sclerosis) (HCC)    Neurogenic bladder 04/07/2016   Neuromuscular disorder (HCC)    MS   Rash    patient reports red, flat rash that is now cyling out    Past Surgical History:  Procedure Laterality Date   ABDOMINAL HYSTERECTOMY     arm surgery  2014   broken left wrist after fall from side of [place] while horse riding;  still has rods in wrist    CYSTOSCOPY N/A 06/21/2017   Procedure: CYSTOSCOPY;  Surgeon: Jannis Kate Norris, MD;  Location: [facility];  Service: Gynecology;  Laterality: N/A;   KNEE SURGERY  1998   reconstructive ACL    NOSE SURGERY  2013   broken nose after fall from horse    TOTAL LAPAROSCOPIC HYSTERECTOMY WITH SALPINGECTOMY Bilateral 06/21/2017   Procedure: TOTAL LAPAROSCOPIC HYSTERECTOMY WITH SALPINGECTOMY;  Surgeon: Jannis Kate Norris, MD;  Location: [facility];  Service: Gynecology;  Laterality: Bilateral;    reports that she has never smoked. She has never used smokeless tobacco. She reports current alcohol use. She reports that she does not use drugs. family history includes Breast cancer in her maternal grandmother and sister; Heart attack in her mother; Osteoporosis in her sister. Allergies  Allergen Reactions    Codeine Hives and Itching    Review of Systems  Constitutional:  Negative for chills and fever.  Genitourinary:  Positive for dysuria. Negative for flank pain and hematuria.      Objective:     BP 110/70 (BP Location: Right Arm, Patient Position: Sitting, Cuff Size: Normal)   Pulse 64   Temp 98.7 F (37.1 C) (Oral)   Wt 125 lb 3.2 oz (56.8 kg)   LMP 04/17/2017 (Approximate)   SpO2 99%   BMI 22.18 kg/m  BP Readings from Last 3 Encounters:  02/27/24 110/70  12/14/23 130/80  05/10/23 110/68   Wt Readings from Last 3 Encounters:  02/27/24 125 lb 3.2 oz (56.8 kg)  12/14/23 124 lb (56.2 kg)  05/10/23 127 lb (57.6 kg)      Physical Exam Vitals reviewed.  Constitutional:      General: She is not in acute distress.    Appearance: She is not ill-appearing.  Cardiovascular:     Rate and Rhythm: Normal rate and regular rhythm.  Pulmonary:     Effort: Pulmonary effort is normal.     Breath sounds: Normal breath sounds. No wheezing or rales.  Neurological:     Mental Status: She is alert.      Results for orders placed or performed in visit on 02/27/24  POCT Urinalysis Dipstick (Automated)  Result Value Ref Range   Color, UA  yellow    Clarity, UA cloudy    Glucose, UA Negative Negative   Bilirubin, UA neg    Ketones, UA neg    Spec Grav, UA 1.010 1.010 - 1.025   Blood, UA 3+    pH, UA 6.0 5.0 - 8.0   Protein, UA Positive (A) Negative   Urobilinogen, UA 0.2 0.2 or 1.0 E.U./dL   Nitrite, UA positive    Leukocytes, UA Large (3+) (A) Negative      The 10-year ASCVD risk score (Arnett DK, et al., 2019) is: 2.2%    Assessment & Plan:   Problem List Items Addressed This Visit   None Visit Diagnoses       Dysuria    -  Primary   Relevant Orders   POCT Urinalysis Dipstick (Automated) (Completed)   Culture, Urine     1 week history of dysuria.  Urine dipstick strongly suggest UTI with positive nitrites, leukocytes, and blood.  Urine culture sent.  Start  Macrobid  1 twice daily for 5 days.  Follow-up for any persistent or worsening symptoms.  No follow-ups on file.    Wolm Scarlet, MD

## 2024-02-29 ENCOUNTER — Ambulatory Visit: Payer: Self-pay | Admitting: Family Medicine

## 2024-02-29 LAB — URINE CULTURE
MICRO NUMBER:: 16694651
SPECIMEN QUALITY:: ADEQUATE

## 2024-03-13 ENCOUNTER — Telehealth: Payer: Self-pay

## 2024-03-13 NOTE — Telephone Encounter (Signed)
 Copied from CRM [id number]. Topic: Clinical - Medical Advice >> Mar 13, 2024  2:40 PM Rebecca Mejia wrote: Reason for CRM: Patient is calling in stating her bladder infection cleared up with the medication and it has now came back within the last few days. Patient is asking she needs another appointment or if more medication can be called in.

## 2024-03-13 NOTE — Telephone Encounter (Signed)
Appt scheduled for follow up.

## 2024-03-14 ENCOUNTER — Ambulatory Visit: Admitting: Family Medicine

## 2024-03-14 VITALS — BP 120/74 | HR 85 | Temp 98.0°F | Wt 125.7 lb

## 2024-03-14 DIAGNOSIS — R300 Dysuria: Secondary | ICD-10-CM

## 2024-03-14 LAB — POC URINALSYSI DIPSTICK (AUTOMATED)
Bilirubin, UA: NEGATIVE
Blood, UA: POSITIVE
Glucose, UA: NEGATIVE
Ketones, UA: NEGATIVE
Nitrite, UA: POSITIVE
Protein, UA: NEGATIVE
Spec Grav, UA: <=1.005 — AB (ref 1.010–1.025)
Urobilinogen, UA: 0.2 U/dL
pH, UA: 7 (ref 5.0–8.0)

## 2024-03-14 MED ORDER — CEPHALEXIN 500 MG PO CAPS: 500.0000 mg | ORAL_CAPSULE | Freq: Two times a day (BID) | ORAL | 0 refills | Status: AC

## 2024-03-14 NOTE — Progress Notes (Signed)
 Established Patient Office Visit  Subjective   Patient ID: Rebecca Mejia, female    DOB: 01-08-1966  Age: 58 y.o. MRN: [id number]  Chief Complaint  Patient presents with   Dysuria    HPI   Rebecca Mejia is seen with possible recurrent UTI.  She was just seen here recently July 14 and had culture that came back positive for E. coli and Streptococcus agalactiae.  She was treated with Macrobid  and symptoms fully cleared after 2 days.  She finished out course of antibiotics.  Started few days ago on Friday with some burning with urination and cloudy urine which were same symptoms as previously.  No flank pain.  No nausea or vomiting.  No fever.  No gross hematuria.  She does have multiple sclerosis and history of neurogenic bladder but has not had any recent UTIs until couple weeks ago.  Past Medical History:  Diagnosis Date   MS (multiple sclerosis) (HCC)    Neurogenic bladder 04/07/2016   Neuromuscular disorder (HCC)    MS   Rash    patient reports red, flat rash that is now cyling out    Past Surgical History:  Procedure Laterality Date   ABDOMINAL HYSTERECTOMY     arm surgery  2014   broken left wrist after fall from side of [place] while horse riding;  still has rods in wrist    CYSTOSCOPY N/A 06/21/2017   Procedure: CYSTOSCOPY;  Surgeon: Jannis Kate Norris, MD;  Location: [facility];  Service: Gynecology;  Laterality: N/A;   KNEE SURGERY  1998   reconstructive ACL    NOSE SURGERY  2013   broken nose after fall from horse    TOTAL LAPAROSCOPIC HYSTERECTOMY WITH SALPINGECTOMY Bilateral 06/21/2017   Procedure: TOTAL LAPAROSCOPIC HYSTERECTOMY WITH SALPINGECTOMY;  Surgeon: Jannis Kate Norris, MD;  Location: [facility];  Service: Gynecology;  Laterality: Bilateral;    reports that she has never smoked. She has never used smokeless tobacco. She reports current alcohol use. She reports that she does not use drugs. family history includes Breast  cancer in her maternal grandmother and sister; Heart attack in her mother; Osteoporosis in her sister. Allergies  Allergen Reactions   Codeine Hives and Itching    Review of Systems  Constitutional:  Negative for chills and fever.  Genitourinary:  Positive for dysuria and frequency. Negative for flank pain.      Objective:     BP 120/74 (BP Location: Left Arm, Patient Position: Sitting, Cuff Size: Normal)   Pulse 85   Temp 98 F (36.7 C) (Oral)   Wt 125 lb 11.2 oz (57 kg)   LMP 04/17/2017 (Approximate)   SpO2 95%   BMI 22.27 kg/m  BP Readings from Last 3 Encounters:  03/14/24 120/74  02/27/24 110/70  12/14/23 130/80   Wt Readings from Last 3 Encounters:  03/14/24 125 lb 11.2 oz (57 kg)  02/27/24 125 lb 3.2 oz (56.8 kg)  12/14/23 124 lb (56.2 kg)      Physical Exam Vitals reviewed.  Constitutional:      General: She is not in acute distress.    Appearance: She is not ill-appearing.  Cardiovascular:     Rate and Rhythm: Normal rate and regular rhythm.  Neurological:     Mental Status: She is alert.      No results found for any visits on 03/14/24.    The 10-year ASCVD risk score (Arnett DK, et al., 2019) is: 2.6%  Assessment & Plan:   Problem List Items Addressed This Visit   None Visit Diagnoses       Dysuria    -  Primary   Relevant Orders   POCT Urinalysis Dipstick (Automated)     Probable recurrent UTI.  Recent positive culture for E. coli treated with Macrobid  and patient symptomatically better after when taking antibiotics.  Urine dipstick reveals leukocytes and nitrites.  Urine culture sent.  Stay well-hydrated.  Start Keflex  500 mg twice daily for 7 days pending culture results  No follow-ups on file.    Wolm Scarlet, MD

## 2024-03-16 ENCOUNTER — Ambulatory Visit: Payer: Self-pay | Admitting: Family Medicine

## 2024-03-16 LAB — URINE CULTURE
MICRO NUMBER:: 16765222
SPECIMEN QUALITY:: ADEQUATE

## 2024-05-25 ENCOUNTER — Other Ambulatory Visit: Payer: Self-pay | Admitting: Neurology

## 2024-05-28 NOTE — Telephone Encounter (Signed)
 Requested Prescriptions   Pending Prescriptions Disp Refills   modafinil  (PROVIGIL ) 100 MG tablet [Pharmacy Med Name: MODAFINIL  100MG  TABLETS] 90 tablet     Sig: TAKE 1 TABLET(100 MG) BY MOUTH DAILY   Last seen 05/10/23, next appt not scheduled  Pt needs another appt for refills  Dispenses   Dispensed Days Supply Quantity Provider Pharmacy  MODAFINIL  100MG  TABLETS 04/09/2024 30 30 each Onita Duos, MD [facility] DRUG STORE #...  MODAFINIL  100MG  TABLETS 12/15/2023 30 30 each Onita Duos, MD [facility] DRUG STORE #...  MODAFINIL  100MG  TABLETS 11/17/2023 30 30 each Onita Duos, MD [facility] DRUG STORE #...  MODAFINIL  100MG  TABLETS 11/08/2023 5 5 each Onita Duos, MD [facility] DRUG STORE #...  MODAFINIL  100MG  TABLETS 10/11/2023 30 25 each Onita Duos, MD [facility] DRUG STORE #...  MODAFINIL  100MG  TABLETS 10/09/2023 5 5 each Onita Duos, MD [facility] DRUG STORE #...  MODAFINIL  100MG  TABLETS 09/09/2023 30 25 each Onita Duos, MD [facility] DRUG STORE #...  MODAFINIL  100MG  TABLETS 09/08/2023 5 5 each Onita Duos, MD [facility] DRUG STORE #...  MODAFINIL  100MG  TABLETS 08/09/2023 30 30 each Onita Duos, MD [facility] DRUG STORE #.[place]
